# Patient Record
Sex: Female | Born: 1980 | Race: White | Hispanic: No | Smoking: Current some day smoker
Health system: Southern US, Community
[De-identification: ages and names within clinical notes are randomized; demographics above are authoritative.]

## PROBLEM LIST (undated history)

## (undated) DIAGNOSIS — J45909 Unspecified asthma, uncomplicated: Secondary | ICD-10-CM

## (undated) DIAGNOSIS — Z789 Other specified health status: Secondary | ICD-10-CM

## (undated) DIAGNOSIS — E876 Hypokalemia: Secondary | ICD-10-CM

## (undated) DIAGNOSIS — I10 Essential (primary) hypertension: Secondary | ICD-10-CM

## (undated) DIAGNOSIS — Z8619 Personal history of other infectious and parasitic diseases: Secondary | ICD-10-CM

## (undated) HISTORY — DX: Unspecified asthma, uncomplicated: J45.909

## (undated) HISTORY — DX: Essential (primary) hypertension: I10

## (undated) HISTORY — PX: WISDOM TOOTH EXTRACTION: SHX21

## (undated) HISTORY — DX: Personal history of other infectious and parasitic diseases: Z86.19

## (undated) HISTORY — PX: KNEE SURGERY: SHX244

## (undated) HISTORY — DX: Hypokalemia: E87.6

---

## 2002-05-09 ENCOUNTER — Encounter: Admission: RE | Admit: 2002-05-09 | Discharge: 2002-06-19 | Payer: Self-pay

## 2009-05-10 ENCOUNTER — Emergency Department (HOSPITAL_BASED_OUTPATIENT_CLINIC_OR_DEPARTMENT_OTHER): Admission: EM | Admit: 2009-05-10 | Discharge: 2009-05-10 | Payer: Self-pay | Admitting: Emergency Medicine

## 2009-05-10 ENCOUNTER — Ambulatory Visit: Payer: Self-pay | Admitting: Diagnostic Radiology

## 2015-02-18 LAB — OB RESULTS CONSOLE GC/CHLAMYDIA
CHLAMYDIA, DNA PROBE: NEGATIVE
GC PROBE AMP, GENITAL: NEGATIVE

## 2015-02-21 LAB — OB RESULTS CONSOLE ABO/RH: RH TYPE: POSITIVE

## 2015-02-21 LAB — OB RESULTS CONSOLE ANTIBODY SCREEN: Antibody Screen: NEGATIVE

## 2015-02-21 LAB — OB RESULTS CONSOLE HIV ANTIBODY (ROUTINE TESTING): HIV: NONREACTIVE

## 2015-02-21 LAB — OB RESULTS CONSOLE RPR: RPR: NONREACTIVE

## 2015-02-21 LAB — OB RESULTS CONSOLE HEPATITIS B SURFACE ANTIGEN: Hepatitis B Surface Ag: NEGATIVE

## 2015-02-26 LAB — OB RESULTS CONSOLE RUBELLA ANTIBODY, IGM: RUBELLA: IMMUNE

## 2015-07-08 ENCOUNTER — Encounter (HOSPITAL_COMMUNITY): Payer: Self-pay | Admitting: *Deleted

## 2015-07-08 ENCOUNTER — Inpatient Hospital Stay (HOSPITAL_COMMUNITY)
Admission: AD | Admit: 2015-07-08 | Discharge: 2015-07-11 | DRG: 781 | Disposition: A | Discharge status: Home / Self Care | Payer: BC Managed Care – PPO | Source: Ambulatory Visit | Attending: Obstetrics and Gynecology | Admitting: Obstetrics and Gynecology

## 2015-07-08 DIAGNOSIS — R9431 Abnormal electrocardiogram [ECG] [EKG]: Secondary | ICD-10-CM

## 2015-07-08 DIAGNOSIS — O26893 Other specified pregnancy related conditions, third trimester: Secondary | ICD-10-CM

## 2015-07-08 DIAGNOSIS — O99283 Endocrine, nutritional and metabolic diseases complicating pregnancy, third trimester: Principal | ICD-10-CM | POA: Diagnosis present

## 2015-07-08 DIAGNOSIS — R519 Headache, unspecified: Secondary | ICD-10-CM

## 2015-07-08 DIAGNOSIS — Z3A30 30 weeks gestation of pregnancy: Secondary | ICD-10-CM | POA: Diagnosis not present

## 2015-07-08 DIAGNOSIS — O09513 Supervision of elderly primigravida, third trimester: Secondary | ICD-10-CM

## 2015-07-08 DIAGNOSIS — R51 Headache: Secondary | ICD-10-CM

## 2015-07-08 DIAGNOSIS — O133 Gestational [pregnancy-induced] hypertension without significant proteinuria, third trimester: Secondary | ICD-10-CM | POA: Diagnosis present

## 2015-07-08 DIAGNOSIS — E876 Hypokalemia: Secondary | ICD-10-CM | POA: Diagnosis present

## 2015-07-08 DIAGNOSIS — O9989 Other specified diseases and conditions complicating pregnancy, childbirth and the puerperium: Secondary | ICD-10-CM | POA: Diagnosis not present

## 2015-07-08 HISTORY — DX: Other specified health status: Z78.9

## 2015-07-08 LAB — URINALYSIS, ROUTINE W REFLEX MICROSCOPIC
Bilirubin Urine: NEGATIVE
Glucose, UA: NEGATIVE mg/dL
HGB URINE DIPSTICK: NEGATIVE
Ketones, ur: NEGATIVE mg/dL
Leukocytes, UA: NEGATIVE
NITRITE: NEGATIVE
PROTEIN: NEGATIVE mg/dL
SPECIFIC GRAVITY, URINE: 1.01 (ref 1.005–1.030)
pH: 7.5 (ref 5.0–8.0)

## 2015-07-08 LAB — CBC
HEMATOCRIT: 31.2 % — AB (ref 36.0–46.0)
Hemoglobin: 11 g/dL — ABNORMAL LOW (ref 12.0–15.0)
MCH: 30.9 pg (ref 26.0–34.0)
MCHC: 35.3 g/dL (ref 30.0–36.0)
MCV: 87.6 fL (ref 78.0–100.0)
PLATELETS: 247 10*3/uL (ref 150–400)
RBC: 3.56 MIL/uL — ABNORMAL LOW (ref 3.87–5.11)
RDW: 12.8 % (ref 11.5–15.5)
WBC: 11.7 10*3/uL — AB (ref 4.0–10.5)

## 2015-07-08 LAB — COMPREHENSIVE METABOLIC PANEL
ALBUMIN: 3 g/dL — AB (ref 3.5–5.0)
ALT: 21 U/L (ref 14–54)
ANION GAP: 10 (ref 5–15)
AST: 21 U/L (ref 15–41)
Alkaline Phosphatase: 69 U/L (ref 38–126)
BUN: 6 mg/dL (ref 6–20)
CHLORIDE: 104 mmol/L (ref 101–111)
CO2: 24 mmol/L (ref 22–32)
Calcium: 9 mg/dL (ref 8.9–10.3)
Creatinine, Ser: 0.54 mg/dL (ref 0.44–1.00)
GFR calc Af Amer: >60 mL/min (ref >60)
GFR calc non Af Amer: >60 mL/min (ref >60)
GLUCOSE: 75 mg/dL (ref 65–99)
POTASSIUM: 2.4 mmol/L — AB (ref 3.5–5.1)
SODIUM: 138 mmol/L (ref 135–145)
Total Bilirubin: 0.6 mg/dL (ref 0.3–1.2)
Total Protein: 6.4 g/dL — ABNORMAL LOW (ref 6.5–8.1)

## 2015-07-08 LAB — PROTEIN / CREATININE RATIO, URINE
CREATININE, URINE: 45 mg/dL
PROTEIN CREATININE RATIO: 0.13 mg/mg{creat} (ref 0.00–0.15)
TOTAL PROTEIN, URINE: 6 mg/dL

## 2015-07-08 LAB — URIC ACID: Uric Acid, Serum: 3.6 mg/dL (ref 2.3–6.6)

## 2015-07-08 LAB — LACTATE DEHYDROGENASE: LDH: 156 U/L (ref 98–192)

## 2015-07-08 LAB — TYPE AND SCREEN
ABO/RH(D): O POS
ANTIBODY SCREEN: NEGATIVE

## 2015-07-08 LAB — ABO/RH: ABO/RH(D): O POS

## 2015-07-08 LAB — MAGNESIUM: MAGNESIUM: 1.8 mg/dL (ref 1.7–2.4)

## 2015-07-08 MED ORDER — PRENATAL MULTIVITAMIN CH
1.0000 | ORAL_TABLET | Freq: Every day | ORAL | Status: DC
  Filled 2015-07-08 (×3): qty 1

## 2015-07-08 MED ORDER — ACETAMINOPHEN 500 MG PO TABS
1000.0000 mg | ORAL_TABLET | Freq: Once | ORAL | Status: AC
  Administered 2015-07-08: 1000 mg via ORAL
  Filled 2015-07-08: qty 2

## 2015-07-08 MED ORDER — ZOLPIDEM TARTRATE 5 MG PO TABS: 5.0000 mg | ORAL_TABLET | Freq: Every evening | ORAL | Status: DC | PRN

## 2015-07-08 MED ORDER — CALCIUM CARBONATE ANTACID 500 MG PO CHEW
2.0000 | CHEWABLE_TABLET | ORAL | Status: DC | PRN
  Filled 2015-07-08: qty 2

## 2015-07-08 MED ORDER — DOCUSATE SODIUM 100 MG PO CAPS
100.0000 mg | ORAL_CAPSULE | Freq: Every day | ORAL | Status: DC
  Filled 2015-07-08 (×3): qty 1

## 2015-07-08 MED ORDER — POTASSIUM CHLORIDE CRYS ER 20 MEQ PO TBCR
40.0000 meq | EXTENDED_RELEASE_TABLET | ORAL | Status: AC
  Administered 2015-07-08 (×2): 40 meq via ORAL
  Filled 2015-07-08 (×2): qty 2

## 2015-07-08 MED ORDER — ACETAMINOPHEN 325 MG PO TABS
650.0000 mg | ORAL_TABLET | ORAL | Status: DC | PRN
  Filled 2015-07-08: qty 2

## 2015-07-08 NOTE — H&P (Signed)
Pt is a 34 y/o white female G1P0 at 3330 5/7 weeks who call the office this am with a headache. [facility] had elevated B/Ps last week. Her B/P was ok however she had a K+ of 2.5 on her CMET. In light of this she had an EKG which was abnl. She has no hx to explain the hypokalemia. She was admitted to get oral K+ and have her EKG rechecked. She has had an uncomplicated pregnancy to date. She has AMA and had a nl NIPT.  Her PE is unremarkable IMP/ Hypokalemia with an abnl EKG Plan/ Will give Kdur 40mg  and repeat in 4 hours. Then repeat K+level. If K+ level is >3.5 will repeat the EKG. Plan d/w cardiology.

## 2015-07-08 NOTE — Progress Notes (Signed)
CRITICAL VALUE ALERT  Critical value received:  Potassium 2.4  Date of notification: 07/08/15   Time of notification:  1321  Critical value read back: yes  Nurse who received alert:  Ninfa MeekerJudy Senaya Dicenso RN  MD notified (1st page):  Blanche EastJ. Rasch NP  Time of first page:  Provider on unit  MD notified (2nd page):N/A  Time of second page:N/A  Responding MD:  N/.A  Time MD responded:  N/A

## 2015-07-08 NOTE — MAU Provider Note (Signed)
History     CSN: [id number]  Arrival date and time: 07/08/15 1106   None     Chief Complaint  Patient presents with  . Hypertension  . Headache   HPI   [name] is a 34 y.o. female G1P0 at 27w5dpresenting to MAU with headache. She started feeling "different" yesterday. Prior to HA she started experiencing nausea, it felt like she was going to throw up. She did not throw up, however continued to have nauea throughout the day, which worsened with eating. Yesterday evening she developed some pressure behind her head, denies pain, however feels pressure behind her eyes.  Resting, and sleeping helped some. Tylenol did not help.   Patient has a history of menstrual migraines. She never took anything for the migraine.   She had two pressures in the office last week that read: 140/89 140/86 and she was instructed to come in with any pre-eclampsia symptoms.   Currently her HA is a 4/10  OB History    Gravida Para Term Preterm AB TAB SAB Ectopic Multiple Living   1               Past Medical History  Diagnosis Date  . Medical history non-contributory     Past Surgical History  Procedure Laterality Date  . Knee surgery      approx 4 to 5 years ago    No family history on file.  Social History  Substance Use Topics  . Smoking status: Former SResearch scientist (life sciences) . Smokeless tobacco: None  . Alcohol Use: No    Allergies:  Allergies  Allergen Reactions  . Neosporin [Neomycin-Bacitracin Zn-Polymyx] Rash    Prescriptions prior to admission  Medication Sig Dispense Refill Last Dose  . acetaminophen (TYLENOL) 325 MG tablet Take 650 mg by mouth every 6 (six) hours as needed for moderate pain.   07/07/2015 at Unknown time  . calcium carbonate (TUMS - DOSED IN MG ELEMENTAL CALCIUM) 500 MG chewable tablet Chew 1 tablet by mouth as needed for indigestion or heartburn.   07/07/2015 at Unknown time  . Prenatal Vit-Fe Fumarate-FA (PRENATAL MULTIVITAMIN) TABS tablet Take 1 tablet by  mouth daily at 12 noon.   07/08/2015 at Unknown time   Results for orders placed or performed during the hospital encounter of 07/08/15 (from the past 48 hour(s))  Urinalysis, [facility] w reflex [facility]     Status: None   Collection Time: 07/08/15 11:40 AM  Result Value Ref Range   Color, Urine YELLOW YELLOW   APPearance CLEAR CLEAR   Specific Gravity, Urine 1.010 1.005 - 1.030   pH 7.5 5.0 - 8.0   Glucose, UA NEGATIVE NEGATIVE mg/dL   Hgb urine dipstick NEGATIVE NEGATIVE   Bilirubin Urine NEGATIVE NEGATIVE   Ketones, ur NEGATIVE NEGATIVE mg/dL   Protein, ur NEGATIVE NEGATIVE mg/dL   Nitrite NEGATIVE NEGATIVE   Leukocytes, UA NEGATIVE NEGATIVE    Comment: MICROSCOPIC NOT DONE ON URINES WITH NEGATIVE PROTEIN, BLOOD, LEUKOCYTES, NITRITE, OR GLUCOSE <1000 mg/dL.  Protein / creatinine ratio, urine     Status: None   Collection Time: 07/08/15 11:45 AM  Result Value Ref Range   Creatinine, Urine 45.00 mg/dL   Total Protein, Urine 6 mg/dL    Comment: NO NORMAL RANGE ESTABLISHED FOR THIS TEST   Protein Creatinine Ratio 0.13 0.00 - 0.15 mg/mg[Cre]  CBC     Status: Abnormal   Collection Time: 07/08/15 12:30 PM  Result Value Ref Range  WBC 11.7 (H) 4.0 - 10.5 K/uL   RBC 3.56 (L) 3.87 - 5.11 MIL/uL   Hemoglobin 11.0 (L) 12.0 - 15.0 g/dL   HCT 31.2 (L) 36.0 - 46.0 %   MCV 87.6 78.0 - 100.0 fL   MCH 30.9 26.0 - 34.0 pg   MCHC 35.3 30.0 - 36.0 g/dL   RDW 12.8 11.5 - 15.5 %   Platelets 247 150 - 400 K/uL  Comprehensive metabolic panel     Status: Abnormal   Collection Time: 07/08/15 12:30 PM  Result Value Ref Range   Sodium 138 135 - 145 mmol/L   Potassium 2.4 (LL) 3.5 - 5.1 mmol/L    Comment: CRITICAL RESULT CALLED TO, READ BACK BY AND VERIFIED WITH: JUDY LOWE 1320 07/08/15 BY A POTEAT    Chloride 104 101 - 111 mmol/L   CO2 24 22 - 32 mmol/L   Glucose, Bld 75 65 - 99 mg/dL   BUN 6 6 - 20 mg/dL   Creatinine, Ser 0.54 0.44 - 1.00 mg/dL   Calcium 9.0 8.9 - 10.3 mg/dL    Total Protein 6.4 (L) 6.5 - 8.1 g/dL   Albumin 3.0 (L) 3.5 - 5.0 g/dL   AST 21 15 - 41 U/L   ALT 21 14 - 54 U/L   Alkaline Phosphatase 69 38 - 126 U/L   Total Bilirubin 0.6 0.3 - 1.2 mg/dL   GFR calc non Af Amer >60 >60 mL/min   GFR calc Af Amer >60 >60 mL/min    Comment: (NOTE) The eGFR has been calculated using the CKD EPI equation. This calculation has not been validated in all clinical situations. eGFR's persistently <60 mL/min signify possible Chronic Kidney Disease.    Anion gap 10 5 - 15  Uric acid     Status: None   Collection Time: 07/08/15 12:30 PM  Result Value Ref Range   Uric Acid, Serum 3.6 2.3 - 6.6 mg/dL  Lactate dehydrogenase     Status: None   Collection Time: 07/08/15 12:30 PM  Result Value Ref Range   LDH 156 98 - 192 U/L  Magnesium     Status: None   Collection Time: 07/08/15 12:30 PM  Result Value Ref Range   Magnesium 1.8 1.7 - 2.4 mg/dL  Type and screen [facility]     Status: None   Collection Time: 07/08/15  3:00 PM  Result Value Ref Range   ABO/RH(D) O POS    Antibody Screen NEG    Sample Expiration 07/11/2015     Review of Systems  Constitutional: Negative for fever and chills.  Eyes: Negative for blurred vision.  Respiratory: Negative for shortness of breath.   Cardiovascular: Positive for leg swelling. Negative for chest pain, palpitations and orthopnea.  Gastrointestinal: Positive for nausea. Negative for vomiting and abdominal pain.  Genitourinary: Negative for dysuria.  Neurological: Positive for dizziness (When she is on her feet. ) and headaches.   Physical Exam   Blood pressure 134/80, pulse 77, temperature 98 F (36.7 C), temperature source Oral, resp. rate 20.   Patient Vitals for the past 24 hrs:  BP Temp Temp src Pulse Resp Height Weight  07/08/15 1721 - - - - 18 - -  07/08/15 1650 - - - - 18 - -  07/08/15 1550 - - - - 18 - -  07/08/15 1530 135/80 mmHg 98.4 F (36.9 C) Oral 79 - _0  (1.676 m) 194 lb  (87.998 kg)  07/08/15 1401 134/80 mmHg - -  77 - - -  07/08/15 1346 136/79 mmHg - - 70 - - -  07/08/15 1331 129/82 mmHg - - 73 - - -  07/08/15 1316 128/76 mmHg - - 77 - - -  07/08/15 1231 122/83 mmHg - - 74 - - -  07/08/15 1216 121/81 mmHg - - 77 - - -  07/08/15 1201 125/77 mmHg - - 78 - - -  07/08/15 1145 134/80 mmHg - - 70 - - -  07/08/15 1124 139/79 mmHg 98 F (36.7 C) Oral 66 20 - -    Physical Exam  Constitutional: She is oriented to person, place, and time. She appears well-developed and well-nourished. No distress.  HENT:  Head: Normocephalic.  Eyes: Pupils are equal, round, and reactive to light.  Cardiovascular: Normal rate.   Respiratory: Effort normal.  GI: Soft. She exhibits no distension. There is no tenderness.  Genitourinary:  Cervix: closed, thick, anterior.   Musculoskeletal: Normal range of motion.  Neurological: She is alert and oriented to person, place, and time.  Skin: Skin is warm. She is not diaphoretic.  Psychiatric: Her behavior is normal.   Fetal Tracing: Baseline: 135 bpm  Variability: Moderate  Accelerations: 15x15 Decelerations: none Toco: UI   MAU Course  Procedures  None  MDM  Potassium level 2.4 Spoke to Dr. Jeneen Rinks at [facility] ED due to critical K+ levels for further recommendation.  Magnesium lab check recommended, if less than 2.5 he recommends admission for IV magnesium.  EKG ordered stat.   1415: consulted with Dr. Radford Pax with Cardiology; patient has T-wave inversion on EKG; they recommend magnesium replacement and potassium replacement with an EKG in the AM.  1430: Dr. Ouida Sills notified of magnesium level, EKG results, cardiology recommendation and the recommendation to admit for electrolyte replacement. Dr. Ouida Sills to consult with [facility]/ medical management.  Admit to [facility]  Patient aware of plan of care.    Patient currently rates her pain 0/10.  Assessment and Plan   A:  1. Hypokalemia   2. Nonspecific abnormal  electrocardiogram (ECG) (EKG)   3. Headache in pregnancy, third trimester    P:  Admit to [facility] per Dr. Ouida Sills.    Lezlie Lye, NP 07/08/2015 5:22 PM

## 2015-07-08 NOTE — MAU Note (Signed)
Pt called MD office, was told to come to MAU, BP was 140/90, 140/80 last Wednesday.  Started having nausea over the weekend, HA that won't go away - describes as pressure, head feels hot.  Decreased FM since last night, also pelvic pressure.  Denies bleeding or LOF.

## 2015-07-09 LAB — BASIC METABOLIC PANEL
ANION GAP: 8 (ref 5–15)
BUN: 6 mg/dL (ref 6–20)
CO2: 22 mmol/L (ref 22–32)
Calcium: 8.6 mg/dL — ABNORMAL LOW (ref 8.9–10.3)
Chloride: 103 mmol/L (ref 101–111)
Creatinine, Ser: 0.57 mg/dL (ref 0.44–1.00)
GFR calc Af Amer: >60 mL/min (ref >60)
GLUCOSE: 114 mg/dL — AB (ref 65–99)
POTASSIUM: 2.6 mmol/L — AB (ref 3.5–5.1)
Sodium: 133 mmol/L — ABNORMAL LOW (ref 135–145)

## 2015-07-09 LAB — POTASSIUM: POTASSIUM: 3 mmol/L — AB (ref 3.5–5.1)

## 2015-07-09 MED ORDER — POTASSIUM CHLORIDE CRYS ER 20 MEQ PO TBCR
40.0000 meq | EXTENDED_RELEASE_TABLET | Freq: Four times a day (QID) | ORAL | Status: AC
  Administered 2015-07-09 – 2015-07-10 (×4): 40 meq via ORAL
  Filled 2015-07-09 (×4): qty 2

## 2015-07-09 MED ORDER — GUAIFENESIN ER 600 MG PO TB12
600.0000 mg | ORAL_TABLET | Freq: Two times a day (BID) | ORAL | Status: DC
Start: 2015-07-09 — End: 2015-07-12
  Administered 2015-07-09 – 2015-07-11 (×5): 600 mg via ORAL
  Filled 2015-07-09 (×7): qty 1

## 2015-07-09 MED ORDER — POTASSIUM CHLORIDE CRYS ER 20 MEQ PO TBCR
40.0000 meq | EXTENDED_RELEASE_TABLET | Freq: Once | ORAL | Status: AC
  Administered 2015-07-09: 40 meq via ORAL
  Filled 2015-07-09: qty 2

## 2015-07-09 MED ORDER — LORATADINE 10 MG PO TABS
10.0000 mg | ORAL_TABLET | Freq: Every day | ORAL | Status: DC
Start: 2015-07-09 — End: 2015-07-12
  Administered 2015-07-09 – 2015-07-11 (×3): 10 mg via ORAL
  Filled 2015-07-09 (×3): qty 1

## 2015-07-09 MED ORDER — LACTATED RINGERS IV SOLN
INTRAVENOUS | Status: DC | PRN
  Administered 2015-07-09: 18:00:00 via INTRAVENOUS

## 2015-07-09 MED ORDER — MAGNESIUM SULFATE 50 % IJ SOLN
2.0000 g | Freq: Once | INTRAVENOUS | Status: AC
  Administered 2015-07-09: 2 g via INTRAVENOUS
  Filled 2015-07-09: qty 4

## 2015-07-09 NOTE — Progress Notes (Signed)
CRITICAL VALUE ALERT  Critical value received:  potassium 2.6  Date of notification:  07/09/15  Time of notification:  1550  Critical value read back: yes  Nurse who received alert:  Jose Persiaarol mead rn   MD notified (1st page):  1553  Time of first page:  1553  MD notified (2nd page):  Time of second page:  Responding MD:  Dr. Henderson CloudHorvath  Time MD responded:  [phone]

## 2015-07-09 NOTE — Progress Notes (Signed)
34 y.o. G1P0 [redacted]w[redacted]d HD#1 admitted for 31WKS, HIGH BP but found to have Harris low K+ with abnormal EKG.  HA today feels more like sinus.  No other sx including no chest pain.  Pt currently stable with no c/o.  Good FM.  Filed Vitals:   07/08/15 1751 07/08/15 1857 07/08/15 2057 07/09/15 0651  BP:   146/73 131/84  Pulse:   76 75  Temp:   98.1 F (36.7 C)   TempSrc:      Resp: Height:      Weight:        Lungs CTA Cor RRR Abd  Soft, gravid, nontender Ex SCDs FHTs  120s, good short term variability, NST R Toco  occ  Results for orders placed or performed during the hospital encounter of 07/08/15 (from the past 24 hour(s))  Urinalysis, Routine w reflex microscopic (not at [facility])     Status: None   Collection Time: 07/08/15 11:40 AM  Result Value Ref Range   Color, Urine YELLOW YELLOW   APPearance CLEAR CLEAR   Specific Gravity, Urine 1.010 1.005 - 1.030   pH 7.5 5.0 - 8.0   Glucose, UA NEGATIVE NEGATIVE mg/dL   Hgb urine dipstick NEGATIVE NEGATIVE   Bilirubin Urine NEGATIVE NEGATIVE   Ketones, ur NEGATIVE NEGATIVE mg/dL   Protein, ur NEGATIVE NEGATIVE mg/dL   Nitrite NEGATIVE NEGATIVE   Leukocytes, UA NEGATIVE NEGATIVE  Protein / creatinine ratio, urine     Status: None   Collection Time: 07/08/15 11:45 AM  Result Value Ref Range   Creatinine, Urine 45.00 mg/dL   Total Protein, Urine 6 mg/dL   Protein Creatinine Ratio 0.13 0.00 - 0.15 mg/mg[Cre]  CBC     Status: Abnormal   Collection Time: 07/08/15 12:30 PM  Result Value Ref Range   WBC 11.7 (H) 4.0 - 10.5 K/uL   RBC 3.56 (L) 3.87 - 5.11 MIL/uL   Hemoglobin 11.0 (L) 12.0 - 15.0 g/dL   HCT 21.3 (L) 08.6 - 57.8 %   MCV 87.6 78.0 - 100.0 fL   MCH 30.9 26.0 - 34.0 pg   MCHC 35.3 30.0 - 36.0 g/dL   RDW 46.9 62.9 - 52.8 %   Platelets 247 150 - 400 K/uL  Comprehensive metabolic panel     Status: Abnormal   Collection Time: 07/08/15 12:30 PM  Result Value Ref Range   Sodium 138 135 - 145 mmol/L   Potassium 2.4 (LL)  3.5 - 5.1 mmol/L   Chloride 104 101 - 111 mmol/L   CO2 24 22 - 32 mmol/L   Glucose, Bld 75 65 - 99 mg/dL   BUN 6 6 - 20 mg/dL   Creatinine, Ser 4.13 0.44 - 1.00 mg/dL   Calcium 9.0 8.9 - 24.4 mg/dL   Total Protein 6.4 (L) 6.5 - 8.1 g/dL   Albumin 3.0 (L) 3.5 - 5.0 g/dL   AST 21 15 - 41 U/L   ALT 21 14 - 54 U/L   Alkaline Phosphatase 69 38 - 126 U/L   Total Bilirubin 0.6 0.3 - 1.2 mg/dL   GFR calc non Af Amer >60 >60 mL/min   GFR calc Af Amer >60 >60 mL/min   Anion gap 10 5 - 15  Uric acid     Status: None   Collection Time: 07/08/15 12:30 PM  Result Value Ref Range   Uric Acid, Serum 3.6 2.3 - 6.6 mg/dL  Lactate dehydrogenase     Status: None  Collection Time: 07/08/15 12:30 PM  Result Value Ref Range   LDH 156 98 - 192 U/L  Magnesium     Status: None   Collection Time: 07/08/15 12:30 PM  Result Value Ref Range   Magnesium 1.8 1.7 - 2.4 mg/dL  [facility]     Status: None   Collection Time: 07/08/15  3:00 PM  Result Value Ref Range   ABO/RH(D) O POS    Antibody Screen NEG    Sample Expiration 07/11/2015   ABO/Rh     Status: None   Collection Time: 07/08/15  3:00 PM  Result Value Ref Range   ABO/RH(D) O POS   Potassium     Status: Abnormal   Collection Time: 07/09/15  6:00 AM  Result Value Ref Range   Potassium 3.0 (L) 3.5 - 5.1 mmol/L    Harris:  HD#1  2336w6d with low K+ and high normal BPs.  P: K+ this am is 3.0-- will replete with another bag of K+ 40 meq and repeat K+ in 4hours after.  Repeat EKG after K+ value is above 3.5.  Pregnancy is stable.  Brittany Harris

## 2015-07-09 NOTE — Progress Notes (Signed)
Pt's repeat K+ was 2.6.  D/w hospitalist over phone- will increase K+ with 4 doses of 40 meq every six hours to push K+ back up.  Will check K+ in am- if still going in wrong direction, will need to consult nephrology to see if they can find out why she is potassium wasting.

## 2015-07-10 LAB — BASIC METABOLIC PANEL
Anion gap: 8 (ref 5–15)
BUN: 7 mg/dL (ref 6–20)
CHLORIDE: 106 mmol/L (ref 101–111)
CO2: 22 mmol/L (ref 22–32)
Calcium: 8.2 mg/dL — ABNORMAL LOW (ref 8.9–10.3)
Creatinine, Ser: 0.55 mg/dL (ref 0.44–1.00)
GFR calc non Af Amer: >60 mL/min (ref >60)
Glucose, Bld: 82 mg/dL (ref 65–99)
POTASSIUM: 3.1 mmol/L — AB (ref 3.5–5.1)
SODIUM: 136 mmol/L (ref 135–145)

## 2015-07-10 LAB — CREATININE, URINE, RANDOM: Creatinine, Urine: 55 mg/dL

## 2015-07-10 LAB — NA AND K (SODIUM & POTASSIUM), RAND UR
POTASSIUM UR: 36 mmol/L
SODIUM UR: 78 mmol/L

## 2015-07-10 LAB — CHLORIDE, URINE, RANDOM: CHLORIDE URINE: 79 mmol/L

## 2015-07-10 LAB — OSMOLALITY, URINE: OSMOLALITY UR: 329 mosm/kg (ref 300–900)

## 2015-07-10 MED ORDER — SODIUM CHLORIDE 0.9 % IJ SOLN
3.0000 mL | Freq: Two times a day (BID) | INTRAMUSCULAR | Status: DC
  Administered 2015-07-10 – 2015-07-11 (×2): 3 mL via INTRAVENOUS

## 2015-07-10 MED ORDER — POTASSIUM CHLORIDE CRYS ER 20 MEQ PO TBCR
40.0000 meq | EXTENDED_RELEASE_TABLET | Freq: Four times a day (QID) | ORAL | Status: AC
  Administered 2015-07-10 – 2015-07-11 (×4): 40 meq via ORAL
  Filled 2015-07-10 (×4): qty 2

## 2015-07-10 NOTE — Consult Note (Addendum)
Renal Service Consult Note [facility]  Brittany Harris 07/10/2015 Brittany Harris,Brittany Harris Requesting Physician:  Dr Dareen PianoAnderson  Reason for Consult:  Pregnant female w severe hypokalemia HPI: The patient is a 34 y.o. year-old with no sig PMH, no medications , admitted for labs showing low K levels in setting of recent headaches and borderline HTN (140/90).  Patient describes problems last week or so with HA's and dizziness. No hx HTN.  Went to see her doctor and BP was 140/90 or so, doesn't usually see doctors so doesn't know baseline.  She is 31 wks IUP.  K+ was 2.4 so pt admitted.  So far has received 280 mEq of K+ by mouth.  K bumped up to 3.0, then back down to 2.6 today.  Urine shows high pH , o/w negative.  UNa high and UNa high , Urine K / creat ratio is high at 65 mEq/gm (anything over 13 mEq/gm considered high) Patient denies any licorice use, hx tobacco, no diuretic or laxative use, no usual diets, no hx low K or HTN in the past.  Denies CP, sob, abd pain , no n/v/Harris, no abd pain, diarrhea or vomiting.   Past Medical History  Past Medical History  Diagnosis Date  . Medical history non-contributory    Past Surgical History  Past Surgical History  Procedure Laterality Date  . Knee surgery      approx 4 to 5 years ago  . Wisdom tooth extraction     Family History No family history on file. Social History  reports that she has quit smoking. She does not have any smokeless tobacco history on file. She reports that she does not drink alcohol or use illicit drugs. Allergies  Allergies  Allergen Reactions  . Neosporin [Neomycin-Bacitracin Zn-Polymyx] Rash   Home medications Prior to Admission medications   Medication Sig Start Date End Date Taking? Authorizing Provider  acetaminophen (TYLENOL) 325 MG tablet Take 650 mg by mouth every 6 (six) hours as needed for moderate pain.   Yes Historical Provider, MD  calcium carbonate (TUMS - DOSED IN MG ELEMENTAL CALCIUM) 500 MG  chewable tablet Chew 1 tablet by mouth as needed for indigestion or heartburn.   Yes Historical Provider, MD  Prenatal Vit-Fe Fumarate-FA (PRENATAL MULTIVITAMIN) TABS tablet Take 1 tablet by mouth daily at 12 noon.   Yes Historical Provider, MD   Liver Function Tests  Recent Labs Lab 07/08/15 1230  AST 21  ALT 21  ALKPHOS 69  BILITOT 0.6  PROT 6.4*  ALBUMIN 3.0*   No results for input(s): LIPASE, AMYLASE in the last 168 hours. CBC  Recent Labs Lab 07/08/15 1230  WBC 11.7*  HGB 11.0*  HCT 31.2*  MCV 87.6  PLT 247   Basic Metabolic Panel  Recent Labs Lab 07/08/15 1230 07/09/15 0600 07/09/15 1507 07/10/15 0645  NA 138  --  133* 136  K 2.4* 3.0* 2.6* 3.1*  CL 104  --  103 106  CO2 24  --  22 22  GLUCOSE 75  --  114* 82  BUN 6  --  6 7  CREATININE 0.54  --  0.57 0.55  CALCIUM 9.0  --  8.6* 8.2*    Filed Vitals:   07/10/15 0700 07/10/15 0830 07/10/15 1345 07/10/15 1515  BP: 135/74 139/83    Pulse: 75     Temp: 98.2 F (36.8 C) 98.2 F (36.8 C) 98 F (36.7 C)   TempSrc: Oral Axillary Oral   Resp: 18  18    Height:      Weight:    88.587 kg (195 lb 4.8 oz)   Exam Alert calm in no distress No rash, cyanosis or gangrene Sclera anicteric, throat clear No jvd or bruits Chest clear bilat RRR soft sem no RG Abd gravid, nontender, no HSM noted +bs GU deferred MS no joint effusion, deformity Ext trace ankle edema bilat Neuro alert no asterixis ox 3  UA pH 7.5, negative otherwise Urine K - 36 mMol/L Urine Na - 78 mMol/L Urine osm  329 mOsm/kg Urine creat  55 mg/dL Serum creat 1.61, serum CO2 = 22-24 Urine K/ Cr ratio = 65 mEq/gm (normal < 13 in setting of hypoK)   Assessment: 1 Hypokalemia - unclear cause, initial tests suggest renal K-wasting.  Urine pH high which could mean RTA , but serum CO2 only borderline low.  Will plan to get 24 hour urine for K and Ca, get plasma renin/ aldo levels and continue oral K supplementation.  Will follow.  She may have  hyperaldo or RTA or Gitelmans syndrome. Don't believe that she has surreptitious vomiting / laxative/ or diuretic use.   2 IUP 31 wks 3 Borderline HTN   Plan - as above  Vinson Moselle MD [facility] pager [phone]    cell [phone] 07/10/2015, 3:51 PM

## 2015-07-10 NOTE — Progress Notes (Signed)
Patient ID: Brittany Harris, female   DOB: [birth year], 34 y.o.   MRN: [id number]  HD# 3 34 yo G1P0 @ 31 weeks with hypokalemia  S: Feeling well today, active FM, no VB or LOF  O:  Filed Vitals:   07/09/15 1545 07/09/15 1947 07/10/15 0700 07/10/15 0830  BP: 137/76 134/88 135/74 139/83  Pulse: 82 75 75   Temp: 98.3 F (36.8 C) 98.4 F (36.9 C) 98.2 F (36.8 C) 98.2 F (36.8 C)  TempSrc: Oral Oral Oral Axillary  Resp: 18 18 18 18   Height:      Weight:       AOX3, NAD Abd Soft Cvx deferred  Results for orders placed or performed during the hospital encounter of 07/08/15 (from the past 24 hour(s))  Basic metabolic panel     Status: Abnormal   Collection Time: 07/09/15  3:07 PM  Result Value Ref Range   Sodium 133 (L) 135 - 145 mmol/L   Potassium 2.6 (LL) 3.5 - 5.1 mmol/L   Chloride 103 101 - 111 mmol/L   CO2 22 22 - 32 mmol/L   Glucose, Bld 114 (H) 65 - 99 mg/dL   BUN 6 6 - 20 mg/dL   Creatinine, Ser 5.640.57 0.44 - 1.00 mg/dL   Calcium 8.6 (L) 8.9 - 10.3 mg/dL   GFR calc non Af Amer >60 >60 mL/min   GFR calc Af Amer >60 >60 mL/min   Anion gap 8 5 - 15  Basic metabolic panel     Status: Abnormal   Collection Time: 07/10/15  6:45 AM  Result Value Ref Range   Sodium 136 135 - 145 mmol/L   Potassium 3.1 (L) 3.5 - 5.1 mmol/L   Chloride 106 101 - 111 mmol/L   CO2 22 22 - 32 mmol/L   Glucose, Bld 82 65 - 99 mg/dL   BUN 7 6 - 20 mg/dL   Creatinine, Ser 3.320.55 0.44 - 1.00 mg/dL   Calcium 8.2 (L) 8.9 - 10.3 mg/dL   GFR calc non Af Amer >60 >60 mL/min   GFR calc Af Amer >60 >60 mL/min   Anion gap 8 5 - 15   A/P 1) FWB normal 2) Will obtain nephrology consult for unexplained hypokalemia

## 2015-07-11 LAB — PROTEIN, URINE, 24 HOUR
Collection Interval-UPROT: 24 hours
URINE TOTAL VOLUME-UPROT: 4225 mL

## 2015-07-11 LAB — COMPREHENSIVE METABOLIC PANEL
ALK PHOS: 63 U/L (ref 38–126)
ALT: 17 U/L (ref 14–54)
AST: 18 U/L (ref 15–41)
Albumin: 2.5 g/dL — ABNORMAL LOW (ref 3.5–5.0)
Anion gap: 7 (ref 5–15)
BILIRUBIN TOTAL: 0.3 mg/dL (ref 0.3–1.2)
BUN: 7 mg/dL (ref 6–20)
CALCIUM: 8.2 mg/dL — AB (ref 8.9–10.3)
CO2: 22 mmol/L (ref 22–32)
CREATININE: 0.47 mg/dL (ref 0.44–1.00)
Chloride: 107 mmol/L (ref 101–111)
GFR calc Af Amer: >60 mL/min (ref >60)
Glucose, Bld: 82 mg/dL (ref 65–99)
POTASSIUM: 3.7 mmol/L (ref 3.5–5.1)
Sodium: 136 mmol/L (ref 135–145)
TOTAL PROTEIN: 5.5 g/dL — AB (ref 6.5–8.1)

## 2015-07-11 LAB — CREATININE, URINE, 24 HOUR
CREATININE 24H UR: 1502 mg/d (ref 600–1800)
CREATININE, URINE: 35.55 mg/dL
Collection Interval-UCRE24: 24 hours
URINE TOTAL VOLUME-UCRE24: 4225 mL

## 2015-07-11 MED ORDER — MAGNESIUM GLUCONATE 500 MG PO TABS
500.0000 mg | ORAL_TABLET | Freq: Every day | ORAL | Status: DC
  Filled 2015-07-11: qty 1

## 2015-07-11 MED ORDER — MAGNESIUM GLUCONATE 1000 (54 MG) MG/5ML PO LIQD
27.0000 mg | Freq: Once | ORAL | Status: DC
  Filled 2015-07-11: qty 2.5

## 2015-07-11 MED ORDER — MAGNESIUM OXIDE 400 (241.3 MG) MG PO TABS
400.0000 mg | ORAL_TABLET | Freq: Every day | ORAL | Status: DC
  Administered 2015-07-11: 400 mg via ORAL
  Filled 2015-07-11 (×2): qty 1

## 2015-07-11 MED ORDER — POTASSIUM CHLORIDE CRYS ER 20 MEQ PO TBCR
40.0000 meq | EXTENDED_RELEASE_TABLET | Freq: Every day | ORAL | Status: DC
  Administered 2015-07-11: 40 meq via ORAL
  Filled 2015-07-11 (×2): qty 2

## 2015-07-11 MED ORDER — MAGNESIUM OXIDE 400 (241.3 MG) MG PO TABS: 400.0000 mg | ORAL_TABLET | Freq: Every day | ORAL | Status: AC

## 2015-07-11 MED ORDER — POTASSIUM CHLORIDE CRYS ER 20 MEQ PO TBCR: 40.0000 meq | EXTENDED_RELEASE_TABLET | Freq: Every day | ORAL | Status: AC

## 2015-07-11 NOTE — Discharge Instructions (Signed)
Hypertension During Pregnancy °Hypertension, or high blood pressure, is when there is extra pressure inside your blood vessels that carry blood from the heart to the rest of your body (arteries). It can happen at any time in life, including pregnancy. Hypertension during pregnancy can cause problems for you and your baby. Your baby might not weigh as much as he or she should at birth or might be born early (premature). Very bad cases of hypertension during pregnancy can be life-threatening.  °Different types of hypertension can occur during pregnancy. These include: °· Chronic hypertension. This happens when a woman has hypertension before pregnancy and it continues during pregnancy. °· Gestational hypertension. This is when hypertension develops during pregnancy. °· Preeclampsia or toxemia of pregnancy. This is a very serious type of hypertension that develops only during pregnancy. It affects the whole body and can be very dangerous for both mother and baby.   °Gestational hypertension and preeclampsia usually go away after your baby is born. Your blood pressure will likely stabilize within 6 weeks. Women who have hypertension during pregnancy have a greater chance of developing hypertension later in life or with future pregnancies. °RISK FACTORS °There are certain factors that make it more likely for you to develop hypertension during pregnancy. These include: °· Having hypertension before pregnancy. °· Having hypertension during a previous pregnancy. °· Being overweight. °· Being older than 40 years. °· Being pregnant with more than one baby. °· Having diabetes or kidney problems. °SIGNS AND SYMPTOMS °Chronic and gestational hypertension rarely cause symptoms. Preeclampsia has symptoms, which may include: °· Increased protein in your urine. Your health care provider will check for this at every prenatal visit. °· Swelling of your hands and face. °· Rapid weight gain. °· Headaches. °· Visual changes. °· Being  bothered by light. °· Abdominal pain, especially in the upper right area. °· Chest pain. °· Shortness of breath. °· Increased reflexes. °· Seizures. These occur with a more severe form of preeclampsia, called eclampsia. °DIAGNOSIS  °You may be diagnosed with hypertension during a regular prenatal exam. At each prenatal visit, you may have: °· Your blood pressure checked. °· A urine test to check for protein in your urine. °The type of hypertension you are diagnosed with depends on when you developed it. It also depends on your specific blood pressure reading. °· Developing hypertension before 20 weeks of pregnancy is consistent with chronic hypertension. °· Developing hypertension after 20 weeks of pregnancy is consistent with gestational hypertension. °· Hypertension with increased urinary protein is diagnosed as preeclampsia. °· Blood pressure measurements that stay above 160 systolic or 110 diastolic are a sign of severe preeclampsia. °TREATMENT °Treatment for hypertension during pregnancy varies. Treatment depends on the type of hypertension and how serious it is. °· If you take medicine for chronic hypertension, you may need to switch medicines. °¨ Medicines called ACE inhibitors should not be taken during pregnancy. °¨ Low-dose aspirin may be suggested for women who have risk factors for preeclampsia. °· If you have gestational hypertension, you may need to take a blood pressure medicine that is safe during pregnancy. Your health care provider will recommend the correct medicine. °· If you have severe preeclampsia, you may need to be in the hospital. Health care providers will watch you and your baby very closely. You also may need to take medicine called magnesium sulfate to prevent seizures and lower blood pressure. °· Sometimes, an early delivery is needed. This may be the case if the condition worsens. It would be   done to protect you and your baby. The only cure for preeclampsia is delivery.  Your health  care provider may recommend that you take one low-dose aspirin (81 mg) each day to help prevent high blood pressure during your pregnancy if you are at risk for preeclampsia. You may be at risk for preeclampsia if:  You had preeclampsia or eclampsia during a previous pregnancy.  Your baby did not grow as expected during a previous pregnancy.  You experienced preterm birth with a previous pregnancy.  You experienced a separation of the placenta from the uterus (placental abruption) during a previous pregnancy.  You experienced the loss of your baby during a previous pregnancy.  You are pregnant with more than one baby.  You have other medical conditions, such as diabetes or an autoimmune disease. HOME CARE INSTRUCTIONS  Schedule and keep all of your regular prenatal care appointments. This is important.  Take medicines only as directed by your health care provider. Tell your health care provider about all medicines you take.  Eat as little salt as possible.  Get regular exercise.  Do not drink alcohol.  Do not use tobacco products.  Do not drink products with caffeine.  Lie on your left side when resting. SEEK IMMEDIATE MEDICAL CARE IF:  You have severe abdominal pain.  You have sudden swelling in your hands, ankles, or face.  You gain 4 pounds (1.8 kg) or more in 1 week.  You vomit repeatedly.  You have vaginal bleeding.  You do not feel your baby moving as much.  You have a headache.  You have blurred or double vision.  You have muscle twitching or spasms.  You have shortness of breath.  You have blue fingernails or lips.  You have blood in your urine. MAKE SURE YOU:  Understand these instructions.  Will watch your condition.  Will get help right away if you are not doing well or get worse.   This information is not intended to replace advice given to you by your health care provider. Make sure you discuss any questions you have with your health care  provider.   Document Released: 04/14/2011 Document Revised: 08/17/2014 Document Reviewed: 02/23/2013 Elsevier Interactive Patient Education 2016 [employer] Preterm Labor Information Preterm labor is when labor starts at less than 37 weeks of pregnancy. The normal length of a pregnancy is 39 to 41 weeks. CAUSES Often, there is no identifiable underlying cause as to why a woman goes into preterm labor. One of the most common known causes of preterm labor is infection. Infections of the uterus, cervix, vagina, amniotic sac, bladder, kidney, or even the lungs (pneumonia) can cause labor to start. Other suspected causes of preterm labor include:   Urogenital infections, such as yeast infections and bacterial vaginosis.   Uterine abnormalities (uterine shape, uterine septum, fibroids, or bleeding from the placenta).   A cervix that has been operated on (it may fail to stay closed).   Malformations in the fetus.   Multiple gestations (twins, triplets, and so on).   Breakage of the amniotic sac.  RISK FACTORS  Having a previous history of preterm labor.   Having premature rupture of membranes (PROM).   Having a placenta that covers the opening of the cervix (placenta previa).   Having a placenta that separates from the uterus (placental abruption).   Having a cervix that is too weak to hold the fetus in the uterus (incompetent cervix).   Having too much fluid in the amniotic sac (  polyhydramnios).   Taking illegal drugs or smoking while pregnant.   Not gaining enough weight while pregnant.   Being younger than 6 and older than 35 years old.   Having a low socioeconomic status.   Being African American. SYMPTOMS Signs and symptoms of preterm labor include:   Menstrual-like cramps, abdominal pain, or back pain.  Uterine contractions that are regular, as frequent as six in an hour, regardless of their intensity (may be mild or painful).  Contractions that  start on the top of the uterus and spread down to the lower abdomen and back.   A sense of increased pelvic pressure.   A watery or bloody mucus discharge that comes from the vagina.  TREATMENT Depending on the length of the pregnancy and other circumstances, your health care provider may suggest bed rest. If necessary, there are medicines that can be given to stop contractions and to mature the fetal lungs. If labor happens before 34 weeks of pregnancy, a prolonged hospital stay may be recommended. Treatment depends on the condition of both you and the fetus.  WHAT SHOULD YOU DO IF YOU THINK YOU ARE IN PRETERM LABOR? Call your health care provider right away. You will need to go to the hospital to get checked immediately. HOW CAN YOU PREVENT PRETERM LABOR IN FUTURE PREGNANCIES? You should:   Stop smoking if you smoke.  Maintain healthy weight gain and avoid chemicals and drugs that are not necessary.  Be watchful for any type of infection.  Inform your health care provider if you have a known history of preterm labor.   This information is not intended to replace advice given to you by your health care provider. Make sure you discuss any questions you have with your health care provider.   Document Released: 10/17/2003 Document Revised: 03/29/2013 Document Reviewed: 08/29/2012 Elsevier Interactive Patient Education 2016 Elsevier Inc. Fetal Movement Counts Patient Name: __________________________________________________ Patient Due Date: ____________________ Performing a fetal movement count is highly recommended in high-risk pregnancies, but it is good for every pregnant woman to do. Your health care provider may ask you to start counting fetal movements at 28 weeks of the pregnancy. Fetal movements often increase:  After eating a full meal.  After physical activity.  After eating or drinking something sweet or cold.  At rest. Pay attention to when you feel the baby is most  active. This will help you notice a pattern of your baby's sleep and wake cycles and what factors contribute to an increase in fetal movement. It is important to perform a fetal movement count at the same time each day when your baby is normally most active.  HOW TO COUNT FETAL MOVEMENTS  Find a quiet and comfortable area to sit or lie down on your left side. Lying on your left side provides the best blood and oxygen circulation to your baby.  Write down the day and time on a sheet of paper or in a journal.  Start counting kicks, flutters, swishes, rolls, or jabs in a 2-hour period. You should feel at least 10 movements within 2 hours.  If you do not feel 10 movements in 2 hours, wait 2-3 hours and count again. Look for a change in the pattern or not enough counts in 2 hours. SEEK MEDICAL CARE IF:  You feel less than 10 counts in 2 hours, tried twice.  There is no movement in over an hour.  The pattern is changing or taking longer each day to reach 10  counts in 2 hours.  You feel the baby is not moving as he or she usually does. Date: ____________ Movements: ____________ Start time: ____________ Doreatha Martin time: ____________  Date: ____________ Movements: ____________ Start time: ____________ Doreatha Martin time: ____________ Date: ____________ Movements: ____________ Start time: ____________ Doreatha Martin time: ____________ Date: ____________ Movements: ____________ Start time: ____________ Doreatha Martin time: ____________ Date: ____________ Movements: ____________ Start time: ____________ Doreatha Martin time: ____________ Date: ____________ Movements: ____________ Start time: ____________ Doreatha Martin time: ____________ Date: ____________ Movements: ____________ Start time: ____________ Doreatha Martin time: ____________ Date: ____________ Movements: ____________ Start time: ____________ Doreatha Martin time: ____________  Date: ____________ Movements: ____________ Start time: ____________ Doreatha Martin time: ____________ Date: ____________  Movements: ____________ Start time: ____________ Doreatha Martin time: ____________ Date: ____________ Movements: ____________ Start time: ____________ Doreatha Martin time: ____________ Date: ____________ Movements: ____________ Start time: ____________ Doreatha Martin time: ____________ Date: ____________ Movements: ____________ Start time: ____________ Doreatha Martin time: ____________ Date: ____________ Movements: ____________ Start time: ____________ Doreatha Martin time: ____________ Date: ____________ Movements: ____________ Start time: ____________ Doreatha Martin time: ____________  Date: ____________ Movements: ____________ Start time: ____________ Doreatha Martin time: ____________ Date: ____________ Movements: ____________ Start time: ____________ Doreatha Martin time: ____________ Date: ____________ Movements: ____________ Start time: ____________ Doreatha Martin time: ____________ Date: ____________ Movements: ____________ Start time: ____________ Doreatha Martin time: ____________ Date: ____________ Movements: ____________ Start time: ____________ Doreatha Martin time: ____________ Date: ____________ Movements: ____________ Start time: ____________ Doreatha Martin time: ____________ Date: ____________ Movements: ____________ Start time: ____________ Doreatha Martin time: ____________  Date: ____________ Movements: ____________ Start time: ____________ Doreatha Martin time: ____________ Date: ____________ Movements: ____________ Start time: ____________ Doreatha Martin time: ____________ Date: ____________ Movements: ____________ Start time: ____________ Doreatha Martin time: ____________ Date: ____________ Movements: ____________ Start time: ____________ Doreatha Martin time: ____________ Date: ____________ Movements: ____________ Start time: ____________ Doreatha Martin time: ____________ Date: ____________ Movements: ____________ Start time: ____________ Doreatha Martin time: ____________ Date: ____________ Movements: ____________ Start time: ____________ Doreatha Martin time: ____________  Date: ____________ Movements: ____________ Start time:  ____________ Doreatha Martin time: ____________ Date: ____________ Movements: ____________ Start time: ____________ Doreatha Martin time: ____________ Date: ____________ Movements: ____________ Start time: ____________ Doreatha Martin time: ____________ Date: ____________ Movements: ____________ Start time: ____________ Doreatha Martin time: ____________ Date: ____________ Movements: ____________ Start time: ____________ Doreatha Martin time: ____________ Date: ____________ Movements: ____________ Start time: ____________ Doreatha Martin time: ____________ Date: ____________ Movements: ____________ Start time: ____________ Doreatha Martin time: ____________  Date: ____________ Movements: ____________ Start time: ____________ Doreatha Martin time: ____________ Date: ____________ Movements: ____________ Start time: ____________ Doreatha Martin time: ____________ Date: ____________ Movements: ____________ Start time: ____________ Doreatha Martin time: ____________ Date: ____________ Movements: ____________ Start time: ____________ Doreatha Martin time: ____________ Date: ____________ Movements: ____________ Start time: ____________ Doreatha Martin time: ____________ Date: ____________ Movements: ____________ Start time: ____________ Doreatha Martin time: ____________ Date: ____________ Movements: ____________ Start time: ____________ Doreatha Martin time: ____________  Date: ____________ Movements: ____________ Start time: ____________ Doreatha Martin time: ____________ Date: ____________ Movements: ____________ Start time: ____________ Doreatha Martin time: ____________ Date: ____________ Movements: ____________ Start time: ____________ Doreatha Martin time: ____________ Date: ____________ Movements: ____________ Start time: ____________ Doreatha Martin time: ____________ Date: ____________ Movements: ____________ Start time: ____________ Doreatha Martin time: ____________ Date: ____________ Movements: ____________ Start time: ____________ Doreatha Martin time: ____________ Date: ____________ Movements: ____________ Start time: ____________ Doreatha Martin time: ____________  Date:  ____________ Movements: ____________ Start time: ____________ Doreatha Martin time: ____________ Date: ____________ Movements: ____________ Start time: ____________ Doreatha Martin time: ____________ Date: ____________ Movements: ____________ Start time: ____________ Doreatha Martin time: ____________ Date: ____________ Movements: ____________ Start time: ____________ Doreatha Martin time: ____________ Date: ____________ Movements: ____________ Start time: ____________ Doreatha Martin time: ____________ Date: ____________ Movements: ____________ Start time: ____________ Doreatha Martin time: ____________   This information is not intended to replace advice given  to you by your health care provider. Make sure you discuss any questions you have with your health care provider.   Document Released: 08/26/2006 Document Revised: 08/17/2014 Document Reviewed: 05/23/2012 Elsevier Interactive Patient Education [employer] Elsevier Inc.

## 2015-07-11 NOTE — Progress Notes (Signed)
Dr. Hermelinda MedicusSchwartz,  Notified that patient was ready for discharge by Nephrology.  Will notify notify Dr. Chestine Sporelark.  Joesph Julyoletta Brenda Samano, RN

## 2015-07-11 NOTE — Progress Notes (Signed)
Patient seen by nephrology this afternoon.  Potassium has stabilized.  Plan is to do formal work up after delivery.  Will continue KCl 40mEq daily with goal K >3.8, 400mg  mag oxide daily.  OK for discharge today  Patient continues to feel well.  No s/sx preeclampsia.   EKG repeat day after admission was reviewed by cardiology and read as normal.   BPs since admission have been mainly 130-140/70-90s, with isolated 154/94.  Will plan d/c to home today Elevated BP c/w GHTN vs preeclampsia without severe features--24 hr urine protein collected and pending.  Discussed likely delivery at 37 wks should BPs remain moderate range F/u up early next week for repeat preE labs, including K/Mg w NST All questions answered.

## 2015-07-11 NOTE — Progress Notes (Signed)
Patient ID: Brittany Harris, female   DOB: 05-19-1981, [age] y.o.   MRN: [id number]  HD# 3 34 yo G1P0 @ 31 weeks with hypokalemia  S: Feeling well today, active FM, no VB or LOF , No HA/BV/RUQ pain  O:  Filed Vitals:   07/10/15 1936 07/11/15 0008 07/11/15 0800 07/11/15 0808  BP: 138/87 146/68 152/94   Pulse: 88 80 76   Temp: 98.3 F (36.8 C) 98 F (36.7 C) 97.6 F (36.4 C)   TempSrc: Oral Oral Oral   Resp: 20 18    Height:      Weight:    88.179 kg (194 lb 6.4 oz)   AOX3, NAD Abd Soft Cvx deferred Ext: trace edema bilaterally  Results for orders placed or performed during the hospital encounter of 07/08/15 (from the past 24 hour(s))  Na and K (sodium & potassium), rand urine     Status: None   Collection Time: 07/10/15 10:25 AM  Result Value Ref Range   Sodium, Ur 78 mmol/L   Potassium Urine Timed 36 mmol/L  Creatinine, urine, random     Status: None   Collection Time: 07/10/15 10:25 AM  Result Value Ref Range   Creatinine, Urine 55.00 mg/dL  Osmolality, urine     Status: None   Collection Time: 07/10/15 10:25 AM  Result Value Ref Range   Osmolality, Ur 329 300 - 900 mOsm/kg  Chloride, urine, random     Status: None   Collection Time: 07/10/15 10:25 AM  Result Value Ref Range   Chloride Urine 79 mmol/L  Comprehensive metabolic panel     Status: Abnormal   Collection Time: 07/11/15  5:43 AM  Result Value Ref Range   Sodium 136 135 - 145 mmol/L   Potassium 3.7 3.5 - 5.1 mmol/L   Chloride 107 101 - 111 mmol/L   CO2 22 22 - 32 mmol/L   Glucose, Bld 82 65 - 99 mg/dL   BUN 7 6 - 20 mg/dL   Creatinine, Ser 6.960.47 0.44 - 1.00 mg/dL   Calcium 8.2 (L) 8.9 - 10.3 mg/dL   Total Protein 5.5 (L) 6.5 - 8.1 g/dL   Albumin 2.5 (L) 3.5 - 5.0 g/dL   AST 18 15 - 41 U/L   ALT 17 14 - 54 U/L   Alkaline Phosphatase 63 38 - 126 U/L   Total Bilirubin 0.3 0.3 - 1.2 mg/dL   GFR calc non Af Amer >60 >60 mL/min   GFR calc Af Amer >60 >60 mL/min   Anion gap 7 5 - 15   A/P G1 @ 3370w1d w  hypokalemia 1. Hypokalemia: Nephrology consulted yesterday, 24 hr urine collection for K and Ca in progress.  Potassium wnl this AM at 3.7.  2. Elevated BPs: asymptomatic.  Urine p/c ratio 0.13 on admission, will add urine protein to 24hr urine collection in progress, but low suspicion for preeclampsia at this time.  Will monitor closely, initiate therapy for BP persistently >160/110.  3. AM NST pending, reactive yesterday

## 2015-07-11 NOTE — Discharge Summary (Signed)
Obstetric Discharge Summary Reason for Admission: hypokalemia--K 2.4 on admission Prenatal Procedures: NST  Patient was admitted with severe hypokalemia that was recalcitrant to normal potassium repletion.  Nephrology was consulted.  DDx includes hyperaldosteronism,  RTA or Gitelmans syndrome.  On day of discharge, K was normalized at 3.7 and Mg wnl.  Renal recommended discharge to home with f/u after delivery for further evaluation.  They recommend continued supplementation w K and Mg during pregnancy, w goal K>3.8  BPs during admission also mildly elevated, mainly 130-140/70-90s.  PreE labs wnl.  24 urine collected and urine protein undetectable-  HEMOGLOBIN  Date Value Ref Range Status  07/08/2015 11.0* 12.0 - 15.0 g/dL Final   HCT  Date Value Ref Range Status  07/08/2015 31.2* 36.0 - 46.0 % Final      Discharge Diagnoses: hypokalemia and likely GHTN  Discharge Information: Date: 07/11/2015 Activity: unrestricted Diet: routine Medications: PNV, KCl, Mg oxide Condition: stable Instructions: refer to practice specific booklet Discharge to: home Follow-up Information    [facility] Steadman, MD On 07/15/2015.   Specialty:  Obstetrics   Why:  Monday or Tues for NST, OB visit, repeat labs   Contact information:   [address] [place] [address] [phone]        Brittany Harris 07/11/2015, 5:36 PM

## 2015-07-11 NOTE — Progress Notes (Signed)
  [facility] Progress Note   Subjective: no complaints  Filed Vitals:   07/11/15 0008 07/11/15 0800 07/11/15 0808 07/11/15 1158  BP: 146/68 152/94  133/70  Pulse: 80 76  76  Temp: 98 F (36.7 C) 97.6 F (36.4 C)  98.2 F (36.8 C)  TempSrc: Oral Oral  Oral  Resp: 18   18  Height:      Weight:   88.179 kg (194 lb 6.4 oz)     Inpatient medications: . docusate sodium  100 mg Oral Daily  . guaiFENesin  600 mg Oral BID  . loratadine  10 mg Oral Daily  . magnesium gluconate  500 mg Oral Daily  . potassium chloride  40 mEq Oral Daily  . prenatal multivitamin  1 tablet Oral Q1200  . sodium chloride  3 mL Intravenous Q12H     acetaminophen, calcium carbonate, lactated ringers, zolpidem  Exam: Alert calm in no distress No rash, cyanosis or gangrene Sclera anicteric, throat clear No jvd or bruits Chest clear bilat RRR soft sem no RG Abd gravid, nontender, no HSM noted +bs GU deferred MS no joint effusion, deformity Ext trace ankle edema bilat Neuro alert no asterixis ox 3  UA pH 7.5, neg otherwise   Assessment: 1 Hypokalemia - hyperaldo, RAS, vs other.  Have d/w colleagues, plan is to maintain Mg/ K levels during pregnancy and do work-up after delivery (2-3 months). Recommend KCl 40/ d with adjustments as needed to keep K > 3.8, also po Mg ordered as Mg level was borderline. Our office will contact patient at home to arrange office f/u evaluation for this condition,  ~ 3 months post delivery.  Will sign off.  2 IUP 31 wks 3 Borderline HTN   Plan - as above   Brittany Moselleob [name] MD [facility] pager [phone]    cell [phone] 07/11/2015, 3:49 PM    Recent Labs Lab 07/09/15 1507 07/10/15 0645 07/11/15 0543  NA 133* 136 136  K 2.6* 3.1* 3.7  CL 103 106 107  CO2 22 22 22   GLUCOSE 114* 82 82  BUN 6 7 7   CREATININE 0.57 0.55 0.47  CALCIUM 8.6* 8.2* 8.2*    Recent Labs Lab 07/08/15 1230 07/11/15 0543  AST 21 18  ALT 21 17   ALKPHOS 69 63  BILITOT 0.6 0.3  PROT 6.4* 5.5*  ALBUMIN 3.0* 2.5*    Recent Labs Lab 07/08/15 1230  WBC 11.7*  HGB 11.0*  HCT 31.2*  MCV 87.6  PLT 247

## 2015-07-13 LAB — ALDOSTERONE + RENIN ACTIVITY W/ RATIO
ALDO / PRA Ratio: 1.3 (ref 0.0–30.0)
Aldosterone: 1.1 ng/dL (ref 0.0–30.0)
PRA LC/MS/MS: 0.88 ng/mL/h

## 2015-08-09 LAB — OB RESULTS CONSOLE GBS: GBS: NEGATIVE

## 2015-08-11 NOTE — L&D Delivery Note (Signed)
Delivery Note Patient pushed for 3 hours.  At 6:34 PM a viable female was delivered via Vaginal, Spontaneous Delivery (Presentation: Right Occiput Anterior).  APGAR: 9, 9; weight 7 lb 13.8 oz (3565 g).   Placenta status: Intact, Spontaneous.  Cord: 3 vessels with the following complications: None.  Cord pH: n/a  Significant vulvar edema was noted after delivery.  Due to concern for high risk of urinary retention, a foley catheter was inserted after delivery.    Anesthesia: Local, Epidural  Episiotomy:  none Lacerations:  2nd degree perineal Suture Repair: 2.0 vicryl rapide Est. Blood Loss (mL):  500 mL  Mom to postpartum.  Baby to Couplet care / Skin to Skin.  [facility] GEFFEL Brittany Harris 09/12/2015, 8:35 PM

## 2015-09-05 ENCOUNTER — Other Ambulatory Visit: Payer: Self-pay | Admitting: Obstetrics and Gynecology

## 2015-09-06 ENCOUNTER — Encounter (HOSPITAL_COMMUNITY): Payer: Self-pay | Admitting: *Deleted

## 2015-09-06 ENCOUNTER — Telehealth (HOSPITAL_COMMUNITY): Payer: Self-pay | Admitting: *Deleted

## 2015-09-06 NOTE — Telephone Encounter (Signed)
Preadmission screen  

## 2015-09-09 ENCOUNTER — Telehealth (HOSPITAL_COMMUNITY): Payer: Self-pay | Admitting: *Deleted

## 2015-09-09 ENCOUNTER — Encounter (HOSPITAL_COMMUNITY): Payer: Self-pay | Admitting: *Deleted

## 2015-09-09 NOTE — Telephone Encounter (Signed)
Preadmission screen  

## 2015-09-11 ENCOUNTER — Encounter (HOSPITAL_COMMUNITY): Payer: Self-pay

## 2015-09-11 ENCOUNTER — Inpatient Hospital Stay (HOSPITAL_COMMUNITY)
Admission: RE | Admit: 2015-09-11 | Discharge: 2015-09-14 | DRG: 775 | Disposition: A | Discharge status: Home / Self Care | Payer: BC Managed Care – PPO | Source: Ambulatory Visit | Attending: Obstetrics and Gynecology | Admitting: Obstetrics and Gynecology

## 2015-09-11 DIAGNOSIS — Z87891 Personal history of nicotine dependence: Secondary | ICD-10-CM | POA: Diagnosis not present

## 2015-09-11 DIAGNOSIS — Z809 Family history of malignant neoplasm, unspecified: Secondary | ICD-10-CM

## 2015-09-11 DIAGNOSIS — Z3A40 40 weeks gestation of pregnancy: Secondary | ICD-10-CM | POA: Diagnosis not present

## 2015-09-11 DIAGNOSIS — O480 Post-term pregnancy: Secondary | ICD-10-CM | POA: Diagnosis present

## 2015-09-11 DIAGNOSIS — O409XX0 Polyhydramnios, unspecified trimester, not applicable or unspecified: Principal | ICD-10-CM | POA: Diagnosis present

## 2015-09-11 DIAGNOSIS — Z3490 Encounter for supervision of normal pregnancy, unspecified, unspecified trimester: Secondary | ICD-10-CM

## 2015-09-11 LAB — CBC
HEMATOCRIT: 33.1 % — AB (ref 36.0–46.0)
Hemoglobin: 11.5 g/dL — ABNORMAL LOW (ref 12.0–15.0)
MCH: 30.5 pg (ref 26.0–34.0)
MCHC: 34.7 g/dL (ref 30.0–36.0)
MCV: 87.8 fL (ref 78.0–100.0)
PLATELETS: 260 10*3/uL (ref 150–400)
RBC: 3.77 MIL/uL — ABNORMAL LOW (ref 3.87–5.11)
RDW: 14.1 % (ref 11.5–15.5)
WBC: 12.4 10*3/uL — AB (ref 4.0–10.5)

## 2015-09-11 LAB — TYPE AND SCREEN
ABO/RH(D): O POS
Antibody Screen: NEGATIVE

## 2015-09-11 LAB — RPR: RPR Ser Ql: NONREACTIVE

## 2015-09-11 MED ORDER — TERBUTALINE SULFATE 1 MG/ML IJ SOLN
0.2500 mg | Freq: Once | INTRAMUSCULAR | Status: DC | PRN
  Filled 2015-09-11: qty 1

## 2015-09-11 MED ORDER — CITRIC ACID-SODIUM CITRATE 334-500 MG/5ML PO SOLN: 30.0000 mL | ORAL | Status: DC | PRN

## 2015-09-11 MED ORDER — LIDOCAINE HCL (PF) 1 % IJ SOLN
30.0000 mL | INTRAMUSCULAR | Status: DC | PRN
  Administered 2015-09-12: 30 mL via SUBCUTANEOUS
  Filled 2015-09-11: qty 30

## 2015-09-11 MED ORDER — EPHEDRINE 5 MG/ML INJ
10.0000 mg | INTRAVENOUS | Status: DC | PRN
  Filled 2015-09-11: qty 2

## 2015-09-11 MED ORDER — OXYCODONE-ACETAMINOPHEN 5-325 MG PO TABS: 1.0000 | ORAL_TABLET | ORAL | Status: DC | PRN

## 2015-09-11 MED ORDER — FLEET ENEMA 7-19 GM/118ML RE ENEM: 1.0000 | ENEMA | RECTAL | Status: DC | PRN

## 2015-09-11 MED ORDER — LACTATED RINGERS IV SOLN
500.0000 mL | INTRAVENOUS | Status: DC | PRN
  Administered 2015-09-12: 500 mL via INTRAVENOUS

## 2015-09-11 MED ORDER — OXYCODONE-ACETAMINOPHEN 5-325 MG PO TABS: 2.0000 | ORAL_TABLET | ORAL | Status: DC | PRN

## 2015-09-11 MED ORDER — BUTORPHANOL TARTRATE 1 MG/ML IJ SOLN
1.0000 mg | INTRAMUSCULAR | Status: DC | PRN
  Administered 2015-09-11 (×2): 1 mg via INTRAVENOUS
  Filled 2015-09-11: qty 1
  Filled 2015-09-11: qty 2

## 2015-09-11 MED ORDER — ACETAMINOPHEN 325 MG PO TABS: 650.0000 mg | ORAL_TABLET | ORAL | Status: DC | PRN

## 2015-09-11 MED ORDER — MISOPROSTOL 25 MCG QUARTER TABLET
25.0000 ug | ORAL_TABLET | ORAL | Status: DC | PRN
  Administered 2015-09-11 (×2): 25 ug via VAGINAL
  Filled 2015-09-11: qty 0.25
  Filled 2015-09-11: qty 1
  Filled 2015-09-11 (×2): qty 0.25

## 2015-09-11 MED ORDER — OXYTOCIN 10 UNIT/ML IJ SOLN
1.0000 m[IU]/min | INTRAVENOUS | Status: DC
  Administered 2015-09-11: 1 m[IU]/min via INTRAVENOUS
  Filled 2015-09-11: qty 4

## 2015-09-11 MED ORDER — OXYTOCIN BOLUS FROM INFUSION
500.0000 mL | INTRAVENOUS | Status: DC
  Administered 2015-09-12: 500 mL via INTRAVENOUS

## 2015-09-11 MED ORDER — FENTANYL 2.5 MCG/ML BUPIVACAINE 1/10 % EPIDURAL INFUSION (WH - ANES)
14.0000 mL/h | INTRAMUSCULAR | Status: DC | PRN
  Administered 2015-09-12 (×4): 14 mL/h via EPIDURAL
  Filled 2015-09-11 (×3): qty 125

## 2015-09-11 MED ORDER — OXYTOCIN 10 UNIT/ML IJ SOLN
2.5000 [IU]/h | INTRAVENOUS | Status: DC
  Filled 2015-09-11: qty 4

## 2015-09-11 MED ORDER — PHENYLEPHRINE 40 MCG/ML (10ML) SYRINGE FOR IV PUSH (FOR BLOOD PRESSURE SUPPORT)
80.0000 ug | PREFILLED_SYRINGE | INTRAVENOUS | Status: DC | PRN
  Filled 2015-09-11: qty 20
  Filled 2015-09-11: qty 2

## 2015-09-11 MED ORDER — DIPHENHYDRAMINE HCL 50 MG/ML IJ SOLN: 12.5000 mg | INTRAMUSCULAR | Status: DC | PRN

## 2015-09-11 MED ORDER — LACTATED RINGERS IV SOLN
INTRAVENOUS | Status: DC
  Administered 2015-09-11: 01:00:00 via INTRAVENOUS
  Administered 2015-09-11: 125 mL/h via INTRAVENOUS
  Administered 2015-09-12 (×2): via INTRAVENOUS

## 2015-09-11 MED ORDER — ONDANSETRON HCL 4 MG/2ML IJ SOLN: 4.0000 mg | Freq: Four times a day (QID) | INTRAMUSCULAR | Status: DC | PRN

## 2015-09-11 NOTE — H&P (Signed)
Brittany Harris is a 35 y.o. female presenting for IOL for polyhydramnios  35 yo G1P0 At 40+0 presents for IOL for polyhydramnios. Polyhydramnios was noted incidentally on a follow-up US for growth. Polyhydramnios has persisted. Additionally, She was incidentally found to have low potassium. She was seen by nephrology and placed on K+ & Magnesium.  History OB History    Gravida Para Term Preterm AB TAB SAB Ectopic Multiple Living   1         0     Past Medical History  Diagnosis Date  . Medical history non-contributory   . Hypokalemia   . Hx of varicella   . Asthma     exercise induced  . Hypertension    Past Surgical History  Procedure Laterality Date  . Knee surgery      approx 4 to 5 years ago  . Wisdom tooth extraction     Family History: family history includes Cancer in her paternal aunt. Social History:  reports that she quit smoking about 8 months ago. She has never used smokeless tobacco. She reports that she does not drink alcohol or use illicit drugs.   Prenatal Transfer Tool  Maternal Diabetes: No Genetic Screening: Normal Maternal Ultrasounds/Referrals: Abnormal:  Findings:   Other:polyhydramnios Fetal Ultrasounds or other Referrals:  None Maternal Substance Abuse:  No Significant Maternal Medications:  None Significant Maternal Lab Results:  None Other Comments:  None  ROS  Dilation: 1 Effacement (%): 40 Station: Ballotable Exam by:: M.Merrill, RN Blood pressure 119/76, pulse 88, temperature 98.6 F (37 C), temperature source Axillary, resp. rate 16, height  (1.676 m), weight 88.905 kg (196 lb). Exam Physical Exam  Prenatal labs: ABO, Rh: --/--/O POS (02/01 0100) Antibody: NEG (02/01 0100) Rubella: Immune (07/19 0000) RPR: Nonreactive (07/14 0000)  HBsAg: Negative (07/14 0000)  HIV: Non-reactive (07/14 0000)  GBS: Negative (12/30 0000)   Assessment/Plan: 1) Admit 2) Cyctotec 3) Epidural on request   Suki Crockett H. 09/11/2015, 8:54  AM

## 2015-09-11 NOTE — Progress Notes (Signed)
Patient ID: Brittany Harris, female   DOB: 1981-05-10, 35 y.o.   MRN: [id number]   S: Comfortable but feeling contractions O: Filed Vitals:   09/11/15 0407 09/11/15 0522 09/11/15 0629 09/11/15 0631  BP: 125/80 124/80 119/76 119/76  Pulse: 79 76 88 88  Temp:    98.6 F (37 C)  TempSrc:    Axillary  Resp: Height:      Weight:       AOx3 FHR 140 reactive, category 1 tracing Cvx 1/60/-2 toco Q2  Foley bulb placed without difficulty  A/P  1) Continue induction 2) Fetal well being reassuring

## 2015-09-12 ENCOUNTER — Encounter (HOSPITAL_COMMUNITY): Payer: Self-pay

## 2015-09-12 ENCOUNTER — Inpatient Hospital Stay (HOSPITAL_COMMUNITY): Payer: BC Managed Care – PPO | Admitting: Anesthesiology

## 2015-09-12 MED ORDER — DIPHENHYDRAMINE HCL 25 MG PO CAPS: 25.0000 mg | ORAL_CAPSULE | Freq: Four times a day (QID) | ORAL | Status: DC | PRN

## 2015-09-12 MED ORDER — OXYTOCIN 10 UNIT/ML IJ SOLN: 1.0000 m[IU]/min | INTRAMUSCULAR | Status: DC

## 2015-09-12 MED ORDER — ONDANSETRON HCL 4 MG PO TABS: 4.0000 mg | ORAL_TABLET | ORAL | Status: DC | PRN

## 2015-09-12 MED ORDER — ACETAMINOPHEN 325 MG PO TABS: 650.0000 mg | ORAL_TABLET | ORAL | Status: DC | PRN

## 2015-09-12 MED ORDER — MAGNESIUM OXIDE 400 (241.3 MG) MG PO TABS
400.0000 mg | ORAL_TABLET | Freq: Every day | ORAL | Status: DC
  Administered 2015-09-12 – 2015-09-14 (×2): 400 mg via ORAL
  Filled 2015-09-12 (×3): qty 1

## 2015-09-12 MED ORDER — BENZOCAINE-MENTHOL 20-0.5 % EX AERO
1.0000 "application " | INHALATION_SPRAY | CUTANEOUS | Status: DC | PRN
  Administered 2015-09-13 – 2015-09-14 (×2): 1 via TOPICAL
  Filled 2015-09-12 (×2): qty 56

## 2015-09-12 MED ORDER — PRENATAL MULTIVITAMIN CH
1.0000 | ORAL_TABLET | Freq: Every day | ORAL | Status: DC
  Administered 2015-09-14: 1 via ORAL
  Filled 2015-09-12 (×2): qty 1

## 2015-09-12 MED ORDER — LANOLIN HYDROUS EX OINT: TOPICAL_OINTMENT | CUTANEOUS | Status: DC | PRN

## 2015-09-12 MED ORDER — DIBUCAINE 1 % RE OINT
1.0000 "application " | TOPICAL_OINTMENT | RECTAL | Status: DC | PRN
  Administered 2015-09-14: 1 via RECTAL
  Filled 2015-09-12: qty 28

## 2015-09-12 MED ORDER — ONDANSETRON HCL 4 MG/2ML IJ SOLN: 4.0000 mg | INTRAMUSCULAR | Status: DC | PRN

## 2015-09-12 MED ORDER — ACETAMINOPHEN 500 MG PO TABS
1000.0000 mg | ORAL_TABLET | Freq: Once | ORAL | Status: AC
  Administered 2015-09-12: 1000 mg via ORAL
  Filled 2015-09-12: qty 2

## 2015-09-12 MED ORDER — ALBUTEROL SULFATE (2.5 MG/3ML) 0.083% IN NEBU
3.0000 mL | INHALATION_SOLUTION | Freq: Four times a day (QID) | RESPIRATORY_TRACT | Status: DC | PRN
Start: 2015-09-12 — End: 2015-09-14

## 2015-09-12 MED ORDER — POTASSIUM CHLORIDE CRYS ER 20 MEQ PO TBCR
40.0000 meq | EXTENDED_RELEASE_TABLET | Freq: Every day | ORAL | Status: DC
  Administered 2015-09-12 – 2015-09-14 (×2): 40 meq via ORAL
  Filled 2015-09-12 (×3): qty 2

## 2015-09-12 MED ORDER — LIDOCAINE HCL (PF) 1 % IJ SOLN
INTRAMUSCULAR | Status: DC | PRN
  Administered 2015-09-12: 6 mL via EPIDURAL
  Administered 2015-09-12: 8 mL via EPIDURAL

## 2015-09-12 MED ORDER — TETANUS-DIPHTH-ACELL PERTUSSIS 5-2.5-18.5 LF-MCG/0.5 IM SUSP: 0.5000 mL | Freq: Once | INTRAMUSCULAR | Status: DC

## 2015-09-12 MED ORDER — IBUPROFEN 600 MG PO TABS
600.0000 mg | ORAL_TABLET | Freq: Four times a day (QID) | ORAL | Status: DC
  Administered 2015-09-13 – 2015-09-14 (×7): 600 mg via ORAL
  Filled 2015-09-12 (×7): qty 1

## 2015-09-12 MED ORDER — OXYCODONE-ACETAMINOPHEN 5-325 MG PO TABS: 1.0000 | ORAL_TABLET | ORAL | Status: DC | PRN

## 2015-09-12 MED ORDER — SIMETHICONE 80 MG PO CHEW: 80.0000 mg | CHEWABLE_TABLET | ORAL | Status: DC | PRN

## 2015-09-12 MED ORDER — OXYCODONE-ACETAMINOPHEN 5-325 MG PO TABS
2.0000 | ORAL_TABLET | ORAL | Status: DC | PRN
  Administered 2015-09-12 – 2015-09-14 (×7): 2 via ORAL
  Filled 2015-09-12 (×7): qty 2

## 2015-09-12 MED ORDER — SENNOSIDES-DOCUSATE SODIUM 8.6-50 MG PO TABS
2.0000 | ORAL_TABLET | ORAL | Status: DC
  Administered 2015-09-13 (×2): 2 via ORAL
  Filled 2015-09-12 (×2): qty 2

## 2015-09-12 MED ORDER — WITCH HAZEL-GLYCERIN EX PADS
1.0000 "application " | MEDICATED_PAD | CUTANEOUS | Status: DC | PRN
  Administered 2015-09-14: 1 via TOPICAL

## 2015-09-12 NOTE — Lactation Note (Signed)
This note was copied from the chart of Brittany Jimmye Wisnieski. Lactation Consultation Note  Patient Name: Brittany Harris [id number] Date: 09/12/2015 Reason for consult: Initial assessment Baby at 4 hr of life and mom is worried about latch. The L nipple is longer than the R and baby goes to the L more easily. She has not had success getting a good latch ion the R. The nipple is sore and red. Demonstrated manual expression, colostrum noted bilaterally. Given a spoon. Discussed baby behavior, pumping, feeding frequency, baby belly size, voids, wt loss, breast changes, and nipple care. Given lactation handouts. Aware of OP services and support group. Encouraged mom to call for help at the next feeding to get baby latched to the R.       Maternal Data Has patient been taught Hand Expression?: Yes Does the patient have breastfeeding experience prior to this delivery?: No  Feeding Feeding Type: Breast Fed Length of feed:  (no latch )  LATCH Score/Interventions Latch: Too sleepy or reluctant, no latch achieved, no sucking elicited. Intervention(s): Skin to skin  Audible Swallowing: None Intervention(s): Skin to skin  Type of Nipple: Flat Intervention(s): Reverse pressure  Comfort (Breast/Nipple): Soft / non-tender     Hold (Positioning): Assistance needed to correctly position infant at breast and maintain latch.  LATCH Score: 4  Lactation Tools Discussed/Used WIC Program: No   Consult Status Consult Status: Follow-up Date: 09/13/15 Follow-up type: In-patient    Rulon Eisenmenger 09/12/2015, 10:44 PM

## 2015-09-12 NOTE — Anesthesia Preprocedure Evaluation (Signed)
Anesthesia Evaluation  Patient identified by MRN, date of birth, ID band Patient awake    Reviewed: Allergy & Precautions, H&P , NPO status , Patient's Chart, lab work & pertinent test results  Airway Mallampati: I  TM Distance: >3 FB Neck ROM: full    Dental no notable dental hx.    Pulmonary former smoker,    Pulmonary exam normal        Cardiovascular hypertension, negative cardio ROS Normal cardiovascular exam     Neuro/Psych negative neurological ROS  negative psych ROS   GI/Hepatic negative GI ROS, Neg liver ROS,   Endo/Other  negative endocrine ROS  Renal/GU negative Renal ROS     Musculoskeletal   Abdominal (+) + obese,   Peds  Hematology negative hematology ROS (+)   Anesthesia Other Findings   Reproductive/Obstetrics (+) Pregnancy                             Anesthesia Physical Anesthesia Plan  ASA: II  Anesthesia Plan: Epidural   Post-op Pain Management:    Induction:   Airway Management Planned:   Additional Equipment:   Intra-op Plan:   Post-operative Plan:   Informed Consent: I have reviewed the patients History and Physical, chart, labs and discussed the procedure including the risks, benefits and alternatives for the proposed anesthesia with the patient or authorized representative who has indicated his/her understanding and acceptance.     Plan Discussed with:   Anesthesia Plan Comments:         Anesthesia Quick Evaluation

## 2015-09-12 NOTE — Anesthesia Procedure Notes (Addendum)
Epidural Patient location during procedure: OB Start time: 09/12/2015 12:56 AM End time: 09/12/2015 1:00 AM  Staffing Anesthesiologist: Leilani Able Performed by: anesthesiologist   Preanesthetic Checklist Completed: patient identified, surgical consent, pre-op evaluation, timeout performed, IV checked, risks and benefits discussed and monitors and equipment checked  Epidural Patient position: sitting Prep: site prepped and draped and DuraPrep Patient monitoring: continuous pulse ox and blood pressure Approach: midline Location: L3-L4 Injection technique: LOR air  Needle:  Needle type: Tuohy  Needle gauge: 17 G Needle length: 9 cm and 9 Needle insertion depth: 6 cm Catheter type: closed end flexible Catheter size: 19 Gauge Catheter at skin depth: 11 cm Test dose: negative and Other  Assessment Sensory level: T9 Events: blood not aspirated, injection not painful, no injection resistance, negative IV test and no paresthesia  Additional Notes Pain goal is 5.Reason for block:procedure for pain

## 2015-09-12 NOTE — Progress Notes (Signed)
Spoke with Dr Dareen Piano. Turn off Pitocin for 30 minutes then restart at half (38milliunits/min). Patient informed. Will continue to monitor.

## 2015-09-12 NOTE — Progress Notes (Signed)
Patient pushing with excellent effort.  Fetal tachycardia noted in the 170s but moderate variability between contractions w + scalp stim w exam.  Tc 100.46F.  No maternal tachycardia.  As temp < 100.72F, does not meet definition of chorioamnionitis at this time.  Tylenol 1 gm now.   Will monitor closely

## 2015-09-12 NOTE — Progress Notes (Signed)
Pt had Foley yesterday. Now 50/4/-2. AROM clear. IUPC placed. NST reactive

## 2015-09-13 LAB — CBC
HCT: 25.9 % — ABNORMAL LOW (ref 36.0–46.0)
HEMOGLOBIN: 8.8 g/dL — AB (ref 12.0–15.0)
MCH: 29.9 pg (ref 26.0–34.0)
MCHC: 34 g/dL (ref 30.0–36.0)
MCV: 88.1 fL (ref 78.0–100.0)
Platelets: 208 10*3/uL (ref 150–400)
RBC: 2.94 MIL/uL — AB (ref 3.87–5.11)
RDW: 14.5 % (ref 11.5–15.5)
WBC: 17.7 10*3/uL — AB (ref 4.0–10.5)

## 2015-09-13 NOTE — Progress Notes (Signed)
Patient is doing well.  She is ambulating, voiding, tolerating PO.  Pain control is good.  Lochia is appropriate  Filed Vitals:   09/12/15 2037 09/12/15 2147 09/13/15 0139 09/13/15 0645  BP: 119/65 128/75 110/60 132/60  Pulse:  72 85 79  Temp: 98.5 F (36.9 C) 97.7 F (36.5 C) 98.2 F (36.8 C) 98 F (36.7 C)  TempSrc: Oral Oral Oral   Resp: Height:      Weight:      SpO2:        NAD Fundus firm Ext: 2+ edema b/l  Lab Results  Component Value Date   WBC 17.7* 09/13/2015   HGB 8.8* 09/13/2015   HCT 25.9* 09/13/2015   MCV 88.1 09/13/2015   PLT 208 09/13/2015    --/--/O POS (02/01 0100)/RImmune  A/P 34 y.o. G1P1001 PPD#1 s/p TSVD. Routine care.   Foley in place d/t significant vulvar edema and concern for potential urinary retention.  Will remove early this afternoon Expect d/c tomorrow.   Desires circ--baby w latching difficulties--will circ later today or tomorrow  [facility] GEFFEL [place]

## 2015-09-13 NOTE — Anesthesia Postprocedure Evaluation (Signed)
Anesthesia Post Note  Patient: Brittany Harris  Procedure(s) Performed: * No procedures listed *  Patient location during evaluation: Mother Baby Anesthesia Type: Epidural Level of consciousness: awake and alert Pain management: pain level controlled Vital Signs Assessment: post-procedure vital signs reviewed and stable Respiratory status: spontaneous breathing Cardiovascular status: blood pressure returned to baseline and stable Postop Assessment: no headache, patient able to bend at knees, no signs of nausea or vomiting and adequate PO intake Anesthetic complications: no    Last Vitals:  Filed Vitals:   09/13/15 0139 09/13/15 0645  BP: 110/60 132/60  Pulse: 85 79  Temp: 36.8 C 36.7 C  Resp: 18 18    Last Pain:  Filed Vitals:   09/13/15 0718  PainSc: 2                  Annalina Needles

## 2015-09-13 NOTE — Lactation Note (Signed)
This note was copied from the chart of Brittany Mabry Santarelli. Lactation Consultation Note  Patient Name: Brittany Harris [id number] Date: 09/13/2015 Reason for consult: Follow-up assessment    With this mom and term baby, now 55 hours old. The baby has more that adequate wet and dirty diapers, which made mom feel less anxious. I assisted mom with hand expression, and she was able to collect about 2 ml's, which we spoon fed to the baby. We that applied 16 nipple shield, and mom learned how to porperly apply. The baby latched, few suckles, mom denied discomfort, but the baby fell asleep after a few suckles, and was left skin to skin. Mom advised to keep baby STS as much as possible, to increse baby's appetite, and stools. Mom reassured that she was doing well, had a good amount of colostrum, and that her baby was fine. Parents very receptive to teaching, and know to call for questions/concerns.   Maternal Data    Feeding Feeding Type: Breast Milk  LATCH Score/Interventions Latch: Repeated attempts needed to sustain latch, nipple held in mouth throughout feeding, stimulation needed to elicit sucking reflex. (baby would not latch without nipple shield, so 16 applied with instruction to mom on how to ) Intervention(s): Skin to skin;Teach feeding cues;Waking techniques Intervention(s): Adjust position;Assist with latch;Breast massage;Breast compression  Audible Swallowing: None Intervention(s): Skin to skin;Hand expression Intervention(s): Hand expression  Type of Nipple: Flat Intervention(s): Hand pump (16 nipple shield)  Comfort (Breast/Nipple): Filling, red/small blisters or bruises, mild/mod discomfort  Problem noted: Mild/Moderate discomfort (left red , tender nipple) Interventions  (Cracked/bleeding/bruising/blister): Expressed breast milk to nipple Interventions (Mild/moderate discomfort): Pre-pump if needed;Breast shields Interventions (Severe discomfort): Double electric pum  Hold  (Positioning): Assistance needed to correctly position infant at breast and maintain latch. Intervention(s): Breastfeeding basics reviewed;Support Pillows;Position options;Skin to skin  LATCH Score: 4  Lactation Tools Discussed/Used Nipple shield size: 16 Pump Review: Setup, frequency, and cleaning   Consult Status Consult Status: Follow-up Date: 09/13/15 Follow-up type: In-patient    Brittany Harris 09/13/2015, 2:06 PM

## 2015-09-13 NOTE — Lactation Note (Signed)
This note was copied from the chart of Brittany Harris. Lactation Consultation Note New mom frustrated d/t can't latch baby to Rt. Nipple. Lt. Nipple extremely sore and bruised d/t only BF on that side. Mom wearing shell on Rt. Nipple. Comfort gels given for painful red bruised nipple. Appears cracked in center, mom stated she always has a little crease but its much bigger now. Also appeared to have a bruised blister to top Rt. Of Lt. Nipple. Mom stated the nipple has always been raised that way, just not bruised.  Has generalized edema, especially in LE. Rt. Nipple w/short shaft on appearance, but when compressed, flattens. Hand expression reviewed, collected approx. 1/2 ml of colostrum in vial and inserted into NS. #20 NS fit well when latched baby in football position. Baby resisted several times screaming mad, calmed down, held in mouth, w/much stimulation suckled at breast. Taught positioning and body alignment. Baby would push back at times, had to keep reviewing things with mom. Put rolled up blanket under moms hand for support. Encouraged to use "C" hold to firm breast for latching. Mom in information over load as well as exhausted. When baby came off of NS noted nipple has shrunk in NS. Mom didn't c/o painful suckling. Nipple wet but no pooling of colostrum noted. Fitted mom w/#16 NS to Rt. Nipple and baby BF well, mom denied painful suckling. Had to keep reminding mom to keep baby cheek to breast, she would let him slip away from breast. Moms body very tense, encouraged to relax and take deep breaths to allow colostrum to flow for the baby. Encouraged at intervals to massage breast. Mom demonstrated NS application. Patient Name: Brittany Harris [id number] Date: 09/13/2015 Reason for consult: Follow-up assessment;Breast/nipple pain;Difficult latch   Maternal Data    Feeding Feeding Type: Breast Fed Length of feed: 40 min  LATCH Score/Interventions Latch: Repeated attempts needed to sustain  latch, nipple held in mouth throughout feeding, stimulation needed to elicit sucking reflex. Intervention(s): Skin to skin;Teach feeding cues;Waking techniques Intervention(s): Adjust position;Assist with latch;Breast massage;Breast compression  Audible Swallowing: A few with stimulation Intervention(s): Skin to skin;Hand expression Intervention(s): Alternate breast massage  Type of Nipple: Flat Intervention(s): Reverse pressure;Shells;Hand pump  Comfort (Breast/Nipple): Filling, red/small blisters or bruises, mild/mod discomfort  Problem noted: Severe discomfort;Cracked, bleeding, blisters, bruises Interventions  (Cracked/bleeding/bruising/blister): Expressed breast milk to nipple;Hand pump  Hold (Positioning): Full assist, staff holds infant at breast Intervention(s): Breastfeeding basics reviewed;Support Pillows;Position options;Skin to skin  LATCH Score: 4  Lactation Tools Discussed/Used Tools: Shells;Nipple Shields;Pump;Comfort gels Nipple shield size: 16;20 Shell Type: Inverted Breast pump type: Manual Date initiated:: 09/12/15   Consult Status Consult Status: Follow-up Date: 09/13/15 (in pm) Follow-up type: In-patient    Charyl Dancer 09/13/2015, 3:37 AM

## 2015-09-14 MED ORDER — FERROUS SULFATE 325 (65 FE) MG PO TABS: 325.0000 mg | ORAL_TABLET | Freq: Every day | ORAL | Status: AC

## 2015-09-14 MED ORDER — OXYCODONE-ACETAMINOPHEN 5-325 MG PO TABS: 1.0000 | ORAL_TABLET | Freq: Four times a day (QID) | ORAL | Status: AC | PRN

## 2015-09-14 MED ORDER — IBUPROFEN 600 MG PO TABS: 600.0000 mg | ORAL_TABLET | Freq: Four times a day (QID) | ORAL | Status: AC

## 2015-09-14 NOTE — Discharge Summary (Signed)
Obstetric Discharge Summary Reason for Admission: induction of labor Prenatal Procedures: none Intrapartum Procedures: spontaneous vaginal delivery Postpartum Procedures: none Complications-Operative and Postpartum: 2 degree perineal laceration HEMOGLOBIN  Date Value Ref Range Status  09/13/2015 8.8* 12.0 - 15.0 g/dL Final   HCT  Date Value Ref Range Status  09/13/2015 25.9* 36.0 - 46.0 % Final    Physical Exam:  General: alert, cooperative and appears stated age 35: appropriate Uterine Fundus: firm Incision: healing well DVT Evaluation: No evidence of DVT seen on physical exam.  Discharge Diagnoses: Term Pregnancy-delivered  Discharge Information: Date: 09/14/2015 Activity: pelvic rest Diet: routine Medications: PNV, Ibuprofen, Iron and Percocet Condition: stable Instructions: refer to practice specific booklet Discharge to: home Follow-up Information    Follow up with [facility] GEFFEL Chestine Spore, MD In 1 month.   Specialty:  Obstetrics   Why:  postpartum visit   Contact information:   [address] [place] [address] [phone]       Newborn Data: Live born female  Birth Weight: 7 lb 13.8 oz (3565 g) APGAR: 9, 9  Home with mother.  Easton Sivertson GEFFEL Ferman Basilio 09/14/2015, 9:27 AM

## 2015-09-14 NOTE — Lactation Note (Signed)
This note was copied from the chart of Brittany Tamerra Merkley. Lactation Consultation Note: Infant is 70 hours old. He is in nursery for circumcision. Mother states that infant is feeding well but she is still having some soreness. Observed nipple and no observed trauma . Advised mother in tips and techniques for better support and positioning to prevent painful latch. Mother request another pack of comfort gels. Mother is continuing to use a #16 mm nipple shield. She has a #20 if needed. I advised mother to page to check latch when infant is feeding. Mother states she is seeing very little colostrum in the shield. Discussed importance of post pumping when using a nipple shield. Advised mother to pump at least 3-4 times daily. Mother also advised to supplement infant with a curved tip or spoon to give additional colostrum after feeding. Parents very receptive to all teaching. Mother states she will phone [facility] dept when she plans to return for follow up visit.   Patient Name: Brittany Harris [id number] Date: 09/14/2015 Reason for consult: Follow-up assessment   Maternal Data    Feeding Feeding Type: Breast Fed Length of feed: 15 min  LATCH Score/Interventions Latch: Grasps breast easily, tongue down, lips flanged, rhythmical sucking. Intervention(s): Skin to skin  Audible Swallowing: A few with stimulation Intervention(s): Skin to skin  Type of Nipple: Flat (using a nipple shield, semi flat nipples) Intervention(s): Hand pump  Comfort (Breast/Nipple): Soft / non-tender  Problem noted: Mild/Moderate discomfort (has had some soreness, comfort gels at bedside ) Interventions (Mild/moderate discomfort): Comfort gels  Hold (Positioning): No assistance needed to correctly position infant at breast. Intervention(s): Breastfeeding basics reviewed;Skin to skin  LATCH Score: 8  Lactation Tools Discussed/Used Tools: Comfort gels Nipple shield size: 16   Consult Status Consult Status:  Complete    Michel Bickers 09/14/2015, 11:11 AM

## 2015-09-14 NOTE — Progress Notes (Signed)
Patient is doing well.  She is ambulating, voiding, tolerating PO.  Pain control is good.  Lochia is appropriate  Filed Vitals:   09/13/15 0645 09/13/15 0930 09/13/15 1806 09/14/15 0626  BP: 132/60 108/68 123/80 108/60  Pulse: 79 68 75 67  Temp: 98 F (36.7 C) 97.3 F (36.3 C) 98.1 F (36.7 C) 98.3 F (36.8 C)  TempSrc:  Oral Oral Oral  Resp: Height:      Weight:      SpO2:  100%  95%    NAD Fundus firm Ext: 2+ edema b/l  Lab Results  Component Value Date   WBC 17.7* 09/13/2015   HGB 8.8* 09/13/2015   HCT 25.9* 09/13/2015   MCV 88.1 09/13/2015   PLT 208 09/13/2015    --/--/O POS (02/01 0100)/RImmune  A/P 34 y.o. G1P1001 PPD#2 s/p TSVD. Routine care.   Voiding well after foley removal--vulvar edema resolving Desires circumcision. Discussed r/b/a of the procedure. Reviewed that circumcision is an elective surgical procedure and not considered medically necessary. Reviewed the risks of the procedure including the risk of infection, bleeding, damage to surrounding structures, including scrotum, shaft, urethra and head of penis, and an undesired cosmetic effect requiring additional procedures for revision. Consent signed.  Meeting all goals, d/c today   [facility] GEFFEL Chestine Spore

## 2016-01-14 DIAGNOSIS — J029 Acute pharyngitis, unspecified: Secondary | ICD-10-CM | POA: Diagnosis not present

## 2016-05-15 DIAGNOSIS — Z0000 Encounter for general adult medical examination without abnormal findings: Secondary | ICD-10-CM | POA: Diagnosis not present

## 2016-05-15 DIAGNOSIS — Z13220 Encounter for screening for lipoid disorders: Secondary | ICD-10-CM | POA: Diagnosis not present

## 2016-12-15 DIAGNOSIS — Z6827 Body mass index (BMI) 27.0-27.9, adult: Secondary | ICD-10-CM | POA: Diagnosis not present

## 2016-12-15 DIAGNOSIS — Z124 Encounter for screening for malignant neoplasm of cervix: Secondary | ICD-10-CM | POA: Diagnosis not present

## 2016-12-15 DIAGNOSIS — Z01419 Encounter for gynecological examination (general) (routine) without abnormal findings: Secondary | ICD-10-CM | POA: Diagnosis not present

## 2016-12-15 DIAGNOSIS — R5383 Other fatigue: Secondary | ICD-10-CM | POA: Diagnosis not present

## 2017-07-06 DIAGNOSIS — L659 Nonscarring hair loss, unspecified: Secondary | ICD-10-CM | POA: Diagnosis not present

## 2017-07-06 DIAGNOSIS — J4520 Mild intermittent asthma, uncomplicated: Secondary | ICD-10-CM | POA: Diagnosis not present

## 2017-07-06 DIAGNOSIS — Z13220 Encounter for screening for lipoid disorders: Secondary | ICD-10-CM | POA: Diagnosis not present

## 2017-07-06 DIAGNOSIS — B001 Herpesviral vesicular dermatitis: Secondary | ICD-10-CM | POA: Diagnosis not present

## 2017-07-06 DIAGNOSIS — Z0000 Encounter for general adult medical examination without abnormal findings: Secondary | ICD-10-CM | POA: Diagnosis not present

## 2018-01-21 DIAGNOSIS — Z124 Encounter for screening for malignant neoplasm of cervix: Secondary | ICD-10-CM | POA: Diagnosis not present

## 2018-01-21 DIAGNOSIS — Z01419 Encounter for gynecological examination (general) (routine) without abnormal findings: Secondary | ICD-10-CM | POA: Diagnosis not present

## 2018-01-21 DIAGNOSIS — Z6827 Body mass index (BMI) 27.0-27.9, adult: Secondary | ICD-10-CM | POA: Diagnosis not present

## 2018-01-24 DIAGNOSIS — Z803 Family history of malignant neoplasm of breast: Secondary | ICD-10-CM | POA: Diagnosis not present

## 2018-01-24 DIAGNOSIS — Z8042 Family history of malignant neoplasm of prostate: Secondary | ICD-10-CM | POA: Diagnosis not present

## 2018-03-09 DIAGNOSIS — N72 Inflammatory disease of cervix uteri: Secondary | ICD-10-CM | POA: Diagnosis not present

## 2018-03-09 DIAGNOSIS — R87619 Unspecified abnormal cytological findings in specimens from cervix uteri: Secondary | ICD-10-CM | POA: Diagnosis not present

## 2018-03-09 DIAGNOSIS — Z6827 Body mass index (BMI) 27.0-27.9, adult: Secondary | ICD-10-CM | POA: Diagnosis not present

## 2018-03-09 DIAGNOSIS — Z3202 Encounter for pregnancy test, result negative: Secondary | ICD-10-CM | POA: Diagnosis not present

## 2018-03-18 DIAGNOSIS — R109 Unspecified abdominal pain: Secondary | ICD-10-CM | POA: Diagnosis not present

## 2018-03-22 DIAGNOSIS — R102 Pelvic and perineal pain: Secondary | ICD-10-CM | POA: Diagnosis not present

## 2018-03-22 DIAGNOSIS — Z6827 Body mass index (BMI) 27.0-27.9, adult: Secondary | ICD-10-CM | POA: Diagnosis not present

## 2018-04-01 DIAGNOSIS — Z1231 Encounter for screening mammogram for malignant neoplasm of breast: Secondary | ICD-10-CM | POA: Diagnosis not present

## 2018-04-01 DIAGNOSIS — Z6827 Body mass index (BMI) 27.0-27.9, adult: Secondary | ICD-10-CM | POA: Diagnosis not present

## 2018-04-06 ENCOUNTER — Ambulatory Visit
Admission: RE | Admit: 2018-04-06 | Discharge: 2018-04-06 | Disposition: A | Discharge status: Home / Self Care | Payer: BLUE CROSS/BLUE SHIELD | Source: Ambulatory Visit | Attending: Physician Assistant | Admitting: Physician Assistant

## 2018-04-06 ENCOUNTER — Other Ambulatory Visit: Payer: Self-pay | Admitting: Physician Assistant

## 2018-04-06 DIAGNOSIS — R1011 Right upper quadrant pain: Secondary | ICD-10-CM

## 2018-04-06 DIAGNOSIS — K5900 Constipation, unspecified: Secondary | ICD-10-CM

## 2018-04-06 DIAGNOSIS — R1013 Epigastric pain: Secondary | ICD-10-CM

## 2018-04-06 DIAGNOSIS — R140 Abdominal distension (gaseous): Secondary | ICD-10-CM | POA: Diagnosis not present

## 2018-04-13 ENCOUNTER — Ambulatory Visit
Admission: RE | Admit: 2018-04-13 | Discharge: 2018-04-13 | Disposition: A | Discharge status: Home / Self Care | Payer: BLUE CROSS/BLUE SHIELD | Source: Ambulatory Visit | Attending: Physician Assistant | Admitting: Physician Assistant

## 2018-04-13 DIAGNOSIS — R1013 Epigastric pain: Secondary | ICD-10-CM

## 2018-04-13 DIAGNOSIS — R1011 Right upper quadrant pain: Secondary | ICD-10-CM

## 2018-04-13 DIAGNOSIS — K5900 Constipation, unspecified: Secondary | ICD-10-CM

## 2018-04-20 DIAGNOSIS — R1011 Right upper quadrant pain: Secondary | ICD-10-CM | POA: Diagnosis not present

## 2018-04-20 DIAGNOSIS — K30 Functional dyspepsia: Secondary | ICD-10-CM | POA: Diagnosis not present

## 2018-04-20 DIAGNOSIS — R1013 Epigastric pain: Secondary | ICD-10-CM | POA: Diagnosis not present

## 2018-04-20 DIAGNOSIS — K5900 Constipation, unspecified: Secondary | ICD-10-CM | POA: Diagnosis not present

## 2018-07-11 DIAGNOSIS — Z13220 Encounter for screening for lipoid disorders: Secondary | ICD-10-CM | POA: Diagnosis not present

## 2018-07-11 DIAGNOSIS — B001 Herpesviral vesicular dermatitis: Secondary | ICD-10-CM | POA: Diagnosis not present

## 2018-07-11 DIAGNOSIS — J4520 Mild intermittent asthma, uncomplicated: Secondary | ICD-10-CM | POA: Diagnosis not present

## 2018-07-11 DIAGNOSIS — Z0000 Encounter for general adult medical examination without abnormal findings: Secondary | ICD-10-CM | POA: Diagnosis not present

## 2018-07-11 DIAGNOSIS — R1013 Epigastric pain: Secondary | ICD-10-CM | POA: Diagnosis not present

## 2018-07-25 ENCOUNTER — Other Ambulatory Visit (HOSPITAL_COMMUNITY): Payer: Self-pay | Admitting: Physician Assistant

## 2018-07-25 DIAGNOSIS — R1013 Epigastric pain: Secondary | ICD-10-CM

## 2018-07-25 DIAGNOSIS — R1011 Right upper quadrant pain: Secondary | ICD-10-CM | POA: Diagnosis not present

## 2018-07-25 DIAGNOSIS — K5900 Constipation, unspecified: Secondary | ICD-10-CM | POA: Diagnosis not present

## 2018-08-09 ENCOUNTER — Ambulatory Visit (HOSPITAL_COMMUNITY)
Admission: RE | Admit: 2018-08-09 | Discharge: 2018-08-09 | Disposition: A | Discharge status: Home / Self Care | Payer: BLUE CROSS/BLUE SHIELD | Source: Ambulatory Visit | Attending: Physician Assistant | Admitting: Physician Assistant

## 2018-08-09 DIAGNOSIS — R1011 Right upper quadrant pain: Secondary | ICD-10-CM | POA: Diagnosis not present

## 2018-08-09 DIAGNOSIS — R1013 Epigastric pain: Secondary | ICD-10-CM | POA: Insufficient documentation

## 2018-08-09 MED ORDER — TECHNETIUM TC 99M MEBROFENIN IV KIT
5.0000 | PACK | Freq: Once | INTRAVENOUS | Status: AC | PRN
  Administered 2018-08-09: 5 via INTRAVENOUS

## 2018-08-17 DIAGNOSIS — R1011 Right upper quadrant pain: Secondary | ICD-10-CM | POA: Diagnosis not present

## 2018-08-17 DIAGNOSIS — R1013 Epigastric pain: Secondary | ICD-10-CM | POA: Diagnosis not present

## 2018-08-17 DIAGNOSIS — K2940 Chronic atrophic gastritis without bleeding: Secondary | ICD-10-CM | POA: Diagnosis not present

## 2018-08-24 DIAGNOSIS — K2940 Chronic atrophic gastritis without bleeding: Secondary | ICD-10-CM | POA: Diagnosis not present

## 2018-09-14 ENCOUNTER — Other Ambulatory Visit: Payer: Self-pay | Admitting: Physician Assistant

## 2018-09-14 DIAGNOSIS — R1011 Right upper quadrant pain: Secondary | ICD-10-CM | POA: Diagnosis not present

## 2018-09-14 DIAGNOSIS — R110 Nausea: Secondary | ICD-10-CM

## 2018-09-14 DIAGNOSIS — R1013 Epigastric pain: Secondary | ICD-10-CM

## 2018-09-14 DIAGNOSIS — K219 Gastro-esophageal reflux disease without esophagitis: Secondary | ICD-10-CM | POA: Diagnosis not present

## 2018-09-14 DIAGNOSIS — K2950 Unspecified chronic gastritis without bleeding: Secondary | ICD-10-CM | POA: Diagnosis not present

## 2018-09-21 ENCOUNTER — Other Ambulatory Visit: Payer: BLUE CROSS/BLUE SHIELD

## 2018-09-21 DIAGNOSIS — K2950 Unspecified chronic gastritis without bleeding: Secondary | ICD-10-CM | POA: Diagnosis not present

## 2018-09-29 DIAGNOSIS — M940 Chondrocostal junction syndrome [Tietze]: Secondary | ICD-10-CM | POA: Diagnosis not present

## 2018-09-29 DIAGNOSIS — M9903 Segmental and somatic dysfunction of lumbar region: Secondary | ICD-10-CM | POA: Diagnosis not present

## 2018-09-29 DIAGNOSIS — M9905 Segmental and somatic dysfunction of pelvic region: Secondary | ICD-10-CM | POA: Diagnosis not present

## 2018-09-29 DIAGNOSIS — M9902 Segmental and somatic dysfunction of thoracic region: Secondary | ICD-10-CM | POA: Diagnosis not present

## 2018-10-06 ENCOUNTER — Other Ambulatory Visit: Payer: BLUE CROSS/BLUE SHIELD

## 2018-10-07 ENCOUNTER — Ambulatory Visit
Admission: RE | Admit: 2018-10-07 | Discharge: 2018-10-07 | Disposition: A | Discharge status: Home / Self Care | Payer: BLUE CROSS/BLUE SHIELD | Source: Ambulatory Visit | Attending: Physician Assistant | Admitting: Physician Assistant

## 2018-10-07 DIAGNOSIS — K5900 Constipation, unspecified: Secondary | ICD-10-CM | POA: Diagnosis not present

## 2018-10-07 DIAGNOSIS — R110 Nausea: Secondary | ICD-10-CM

## 2018-10-07 DIAGNOSIS — R1011 Right upper quadrant pain: Secondary | ICD-10-CM

## 2018-10-07 DIAGNOSIS — R1013 Epigastric pain: Secondary | ICD-10-CM

## 2018-10-07 MED ORDER — IOPAMIDOL (ISOVUE-300) INJECTION 61%
75.0000 mL | Freq: Once | INTRAVENOUS | Status: AC | PRN
  Administered 2018-10-07: 100 mL via INTRAVENOUS

## 2018-10-10 DIAGNOSIS — M940 Chondrocostal junction syndrome [Tietze]: Secondary | ICD-10-CM | POA: Diagnosis not present

## 2018-10-10 DIAGNOSIS — M9903 Segmental and somatic dysfunction of lumbar region: Secondary | ICD-10-CM | POA: Diagnosis not present

## 2018-10-10 DIAGNOSIS — M9902 Segmental and somatic dysfunction of thoracic region: Secondary | ICD-10-CM | POA: Diagnosis not present

## 2018-10-10 DIAGNOSIS — M9905 Segmental and somatic dysfunction of pelvic region: Secondary | ICD-10-CM | POA: Diagnosis not present

## 2018-10-17 DIAGNOSIS — M940 Chondrocostal junction syndrome [Tietze]: Secondary | ICD-10-CM | POA: Diagnosis not present

## 2018-10-17 DIAGNOSIS — M9905 Segmental and somatic dysfunction of pelvic region: Secondary | ICD-10-CM | POA: Diagnosis not present

## 2018-10-17 DIAGNOSIS — M9902 Segmental and somatic dysfunction of thoracic region: Secondary | ICD-10-CM | POA: Diagnosis not present

## 2018-10-17 DIAGNOSIS — M9903 Segmental and somatic dysfunction of lumbar region: Secondary | ICD-10-CM | POA: Diagnosis not present

## 2018-10-20 DIAGNOSIS — M9903 Segmental and somatic dysfunction of lumbar region: Secondary | ICD-10-CM | POA: Diagnosis not present

## 2018-10-20 DIAGNOSIS — M9902 Segmental and somatic dysfunction of thoracic region: Secondary | ICD-10-CM | POA: Diagnosis not present

## 2018-10-20 DIAGNOSIS — M9905 Segmental and somatic dysfunction of pelvic region: Secondary | ICD-10-CM | POA: Diagnosis not present

## 2018-10-20 DIAGNOSIS — M940 Chondrocostal junction syndrome [Tietze]: Secondary | ICD-10-CM | POA: Diagnosis not present

## 2018-10-21 DIAGNOSIS — R87619 Unspecified abnormal cytological findings in specimens from cervix uteri: Secondary | ICD-10-CM | POA: Diagnosis not present

## 2018-10-21 DIAGNOSIS — Z6827 Body mass index (BMI) 27.0-27.9, adult: Secondary | ICD-10-CM | POA: Diagnosis not present

## 2018-10-21 DIAGNOSIS — B977 Papillomavirus as the cause of diseases classified elsewhere: Secondary | ICD-10-CM | POA: Diagnosis not present

## 2018-10-24 DIAGNOSIS — M940 Chondrocostal junction syndrome [Tietze]: Secondary | ICD-10-CM | POA: Diagnosis not present

## 2018-10-24 DIAGNOSIS — M9905 Segmental and somatic dysfunction of pelvic region: Secondary | ICD-10-CM | POA: Diagnosis not present

## 2018-10-24 DIAGNOSIS — M9902 Segmental and somatic dysfunction of thoracic region: Secondary | ICD-10-CM | POA: Diagnosis not present

## 2018-10-24 DIAGNOSIS — M9903 Segmental and somatic dysfunction of lumbar region: Secondary | ICD-10-CM | POA: Diagnosis not present

## 2018-10-27 DIAGNOSIS — M9903 Segmental and somatic dysfunction of lumbar region: Secondary | ICD-10-CM | POA: Diagnosis not present

## 2018-10-27 DIAGNOSIS — M9902 Segmental and somatic dysfunction of thoracic region: Secondary | ICD-10-CM | POA: Diagnosis not present

## 2018-10-27 DIAGNOSIS — M940 Chondrocostal junction syndrome [Tietze]: Secondary | ICD-10-CM | POA: Diagnosis not present

## 2018-10-27 DIAGNOSIS — M9905 Segmental and somatic dysfunction of pelvic region: Secondary | ICD-10-CM | POA: Diagnosis not present

## 2018-12-14 IMAGING — US US ABDOMEN COMPLETE
1 series · 14 of 25 positions shown · non-contrast
Comparison: None.

CLINICAL DATA: Right upper quadrant/epigastric pain.

EXAM:
ABDOMEN ULTRASOUND COMPLETE

[Series 1: us abdomen complete · 0.26mm/px · 14 of 84 slices shown]
[im 1/84]
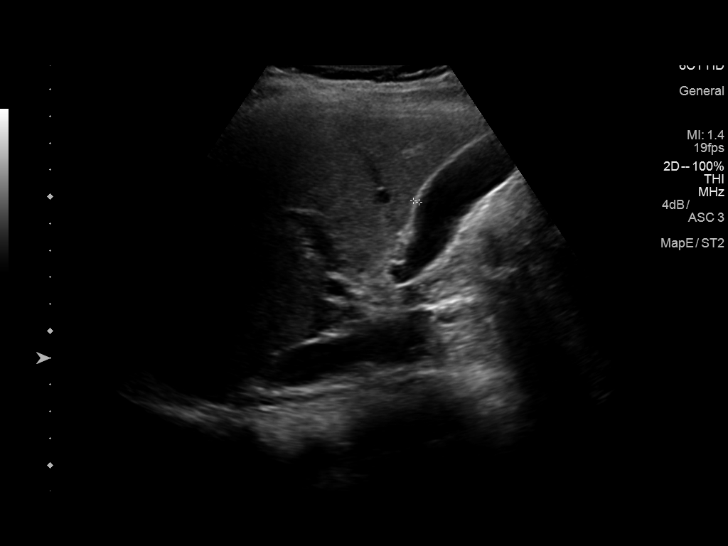
[im 7/84]
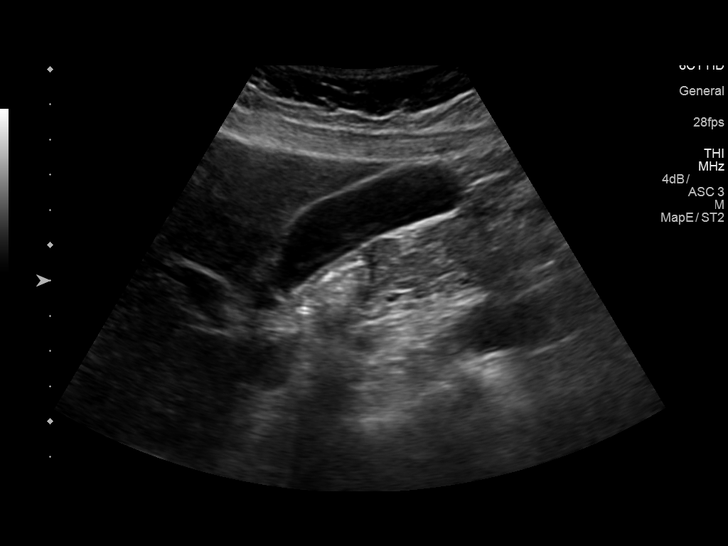
[im 14/84]
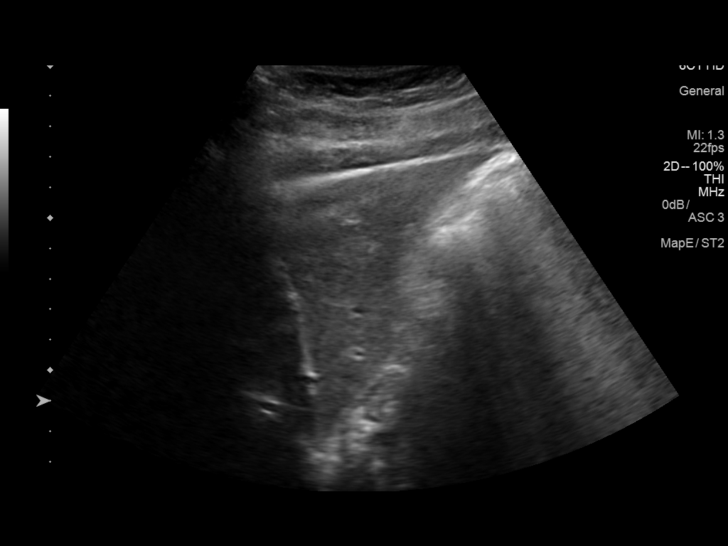
[im 21/84]
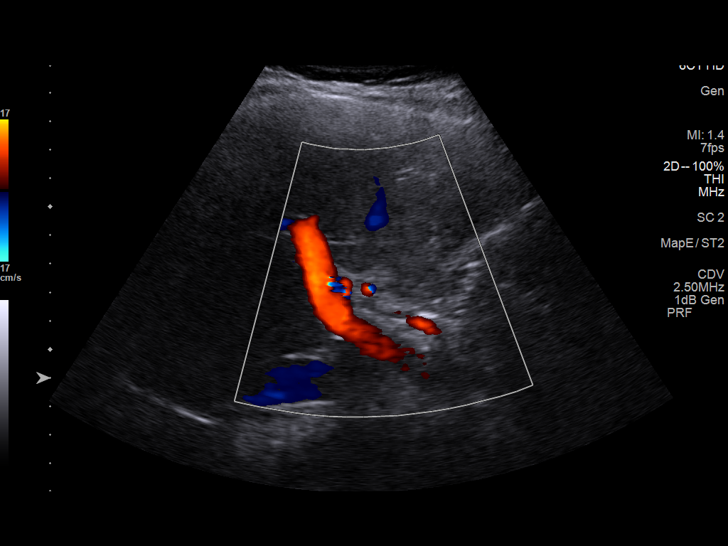
[im 28/84]
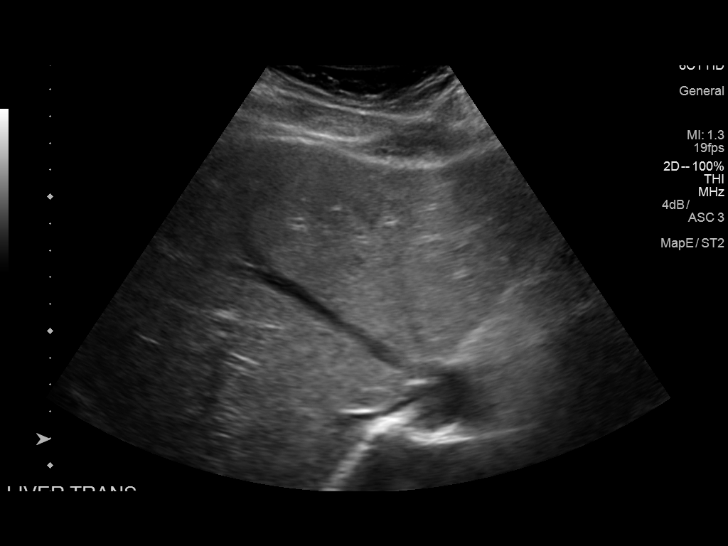
[im 32/84]
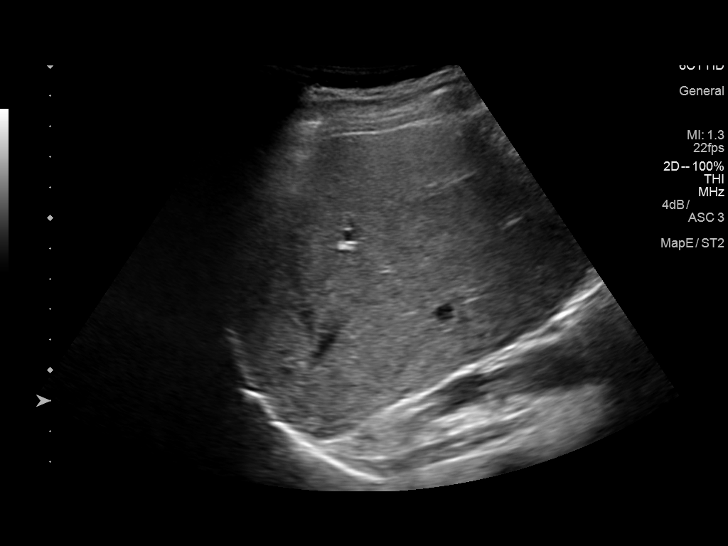
[im 39/84]
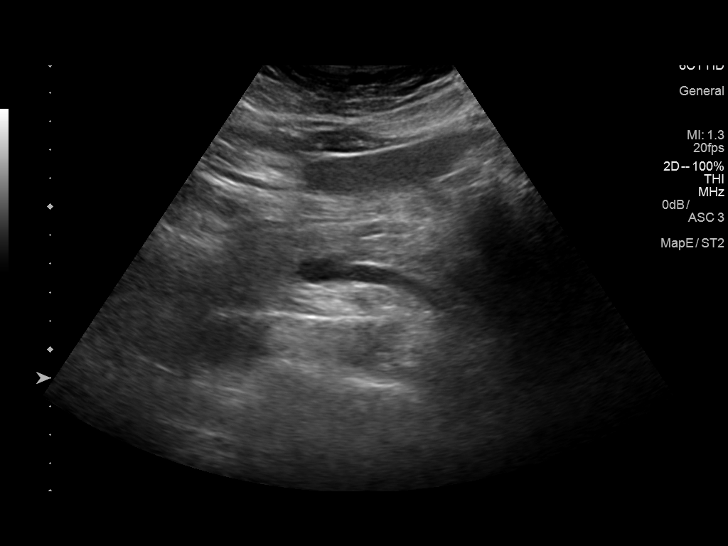
[im 45/84]
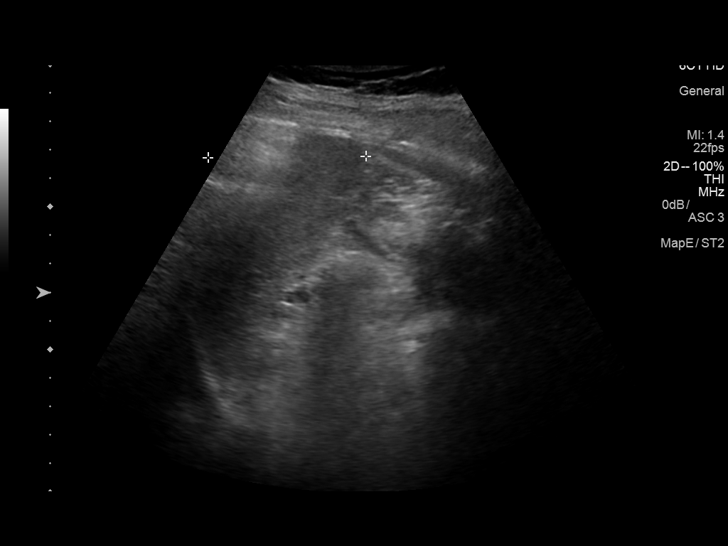
[im 52/84]
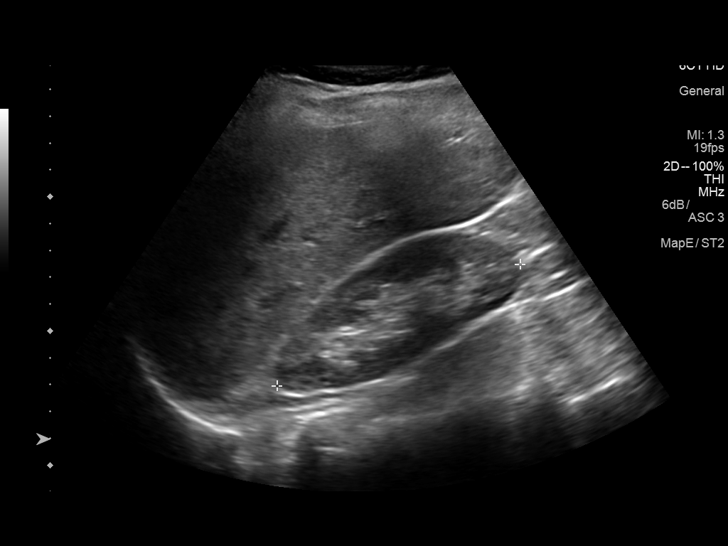
[im 56/84]
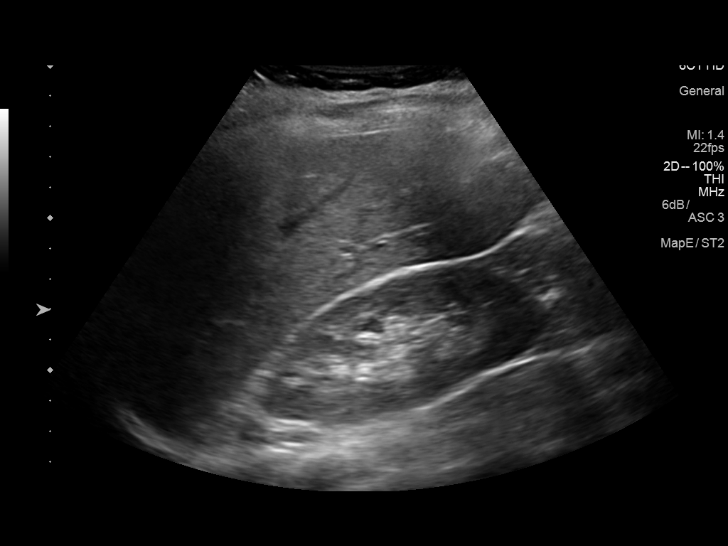
[im 63/84]
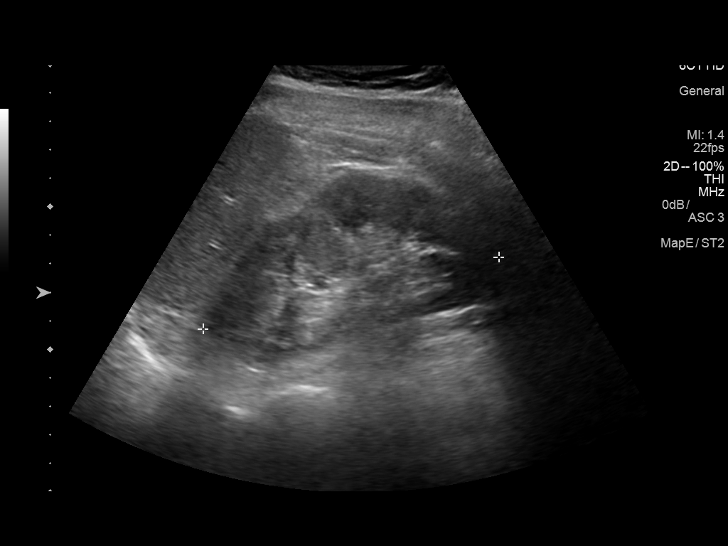
[im 70/84]
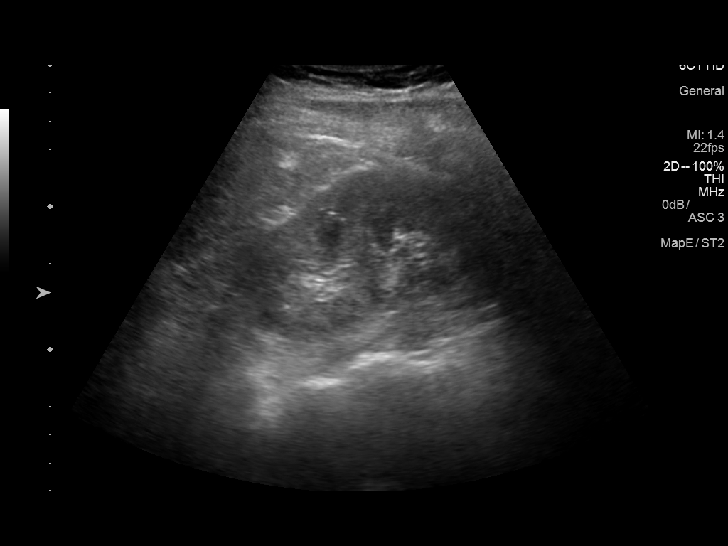
[im 77/84]
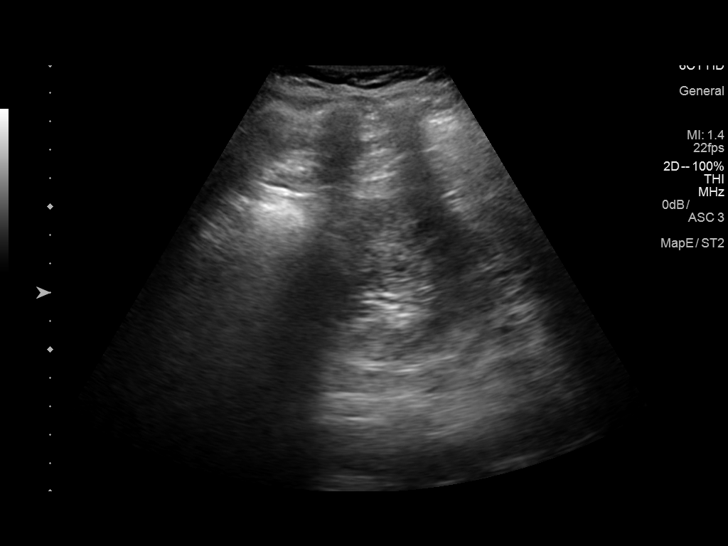
[im 84/84]
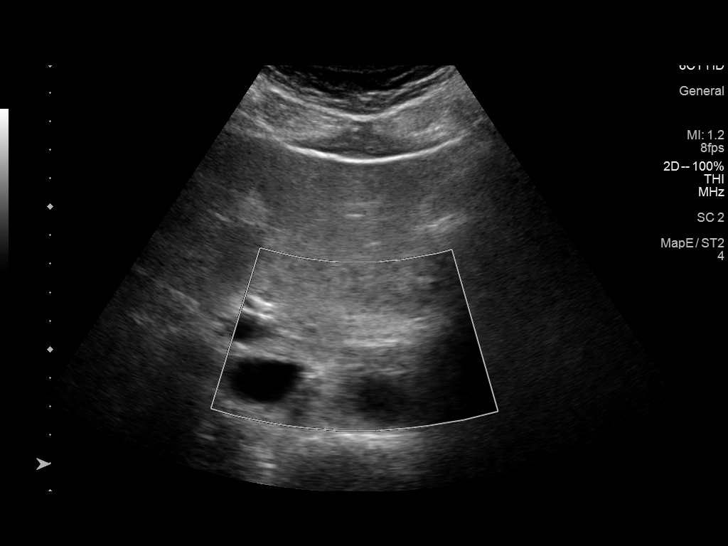

[14 of 25 positions shown; findings below may reference images not displayed]

FINDINGS: Gallbladder: No gallstones or wall thickening visualized. No
sonographic Murphy sign noted by sonographer.

Common bile duct: Diameter: 2.1 mm

Liver: No focal lesion identified. Mildly increased parenchymal
echogenicity. Portal vein is patent on color Doppler imaging with
normal direction of blood flow towards the liver.

IVC: No abnormality visualized.

Pancreas: Visualized portion unremarkable.

Spleen: Size and appearance within normal limits.

Right Kidney: Length: 10.1 cm. Echogenicity within normal limits. No
mass or hydronephrosis visualized.

Left Kidney: Length: 10.7 cm. Echogenicity within normal limits. No
mass or hydronephrosis visualized.

Abdominal aorta: No aneurysm visualized.

Other findings: None.
IMPRESSION: Mildly increased parenchymal echogenicity of the liver, usually
associated with hepatic steatosis.

Otherwise normal abdominal ultrasound.

## 2019-01-31 DIAGNOSIS — A059 Bacterial foodborne intoxication, unspecified: Secondary | ICD-10-CM | POA: Diagnosis not present

## 2019-01-31 DIAGNOSIS — R197 Diarrhea, unspecified: Secondary | ICD-10-CM | POA: Diagnosis not present

## 2019-03-24 ENCOUNTER — Telehealth: Payer: BLUE CROSS/BLUE SHIELD | Admitting: Family

## 2019-03-24 DIAGNOSIS — L237 Allergic contact dermatitis due to plants, except food: Secondary | ICD-10-CM

## 2019-03-24 MED ORDER — PREDNISONE 10 MG (21) PO TBPK: ORAL_TABLET | ORAL | 0 refills | Status: AC

## 2019-03-24 NOTE — Progress Notes (Signed)

## 2019-04-27 ENCOUNTER — Other Ambulatory Visit: Payer: Self-pay | Admitting: Cardiology

## 2019-04-27 DIAGNOSIS — R6889 Other general symptoms and signs: Secondary | ICD-10-CM | POA: Diagnosis not present

## 2019-04-27 DIAGNOSIS — Z20822 Contact with and (suspected) exposure to covid-19: Secondary | ICD-10-CM

## 2019-04-29 LAB — NOVEL CORONAVIRUS, NAA: SARS-CoV-2, NAA: NOT DETECTED

## 2019-08-14 IMAGING — CR DG ABDOMEN 2V
2 series · 2 of 2 positions shown · non-contrast
Comparison: None.

CLINICAL DATA: Epigastric abdominal pain. Right upper quadrant pain

EXAM:
ABDOMEN - 2 VIEW

[w abdomen upright *]
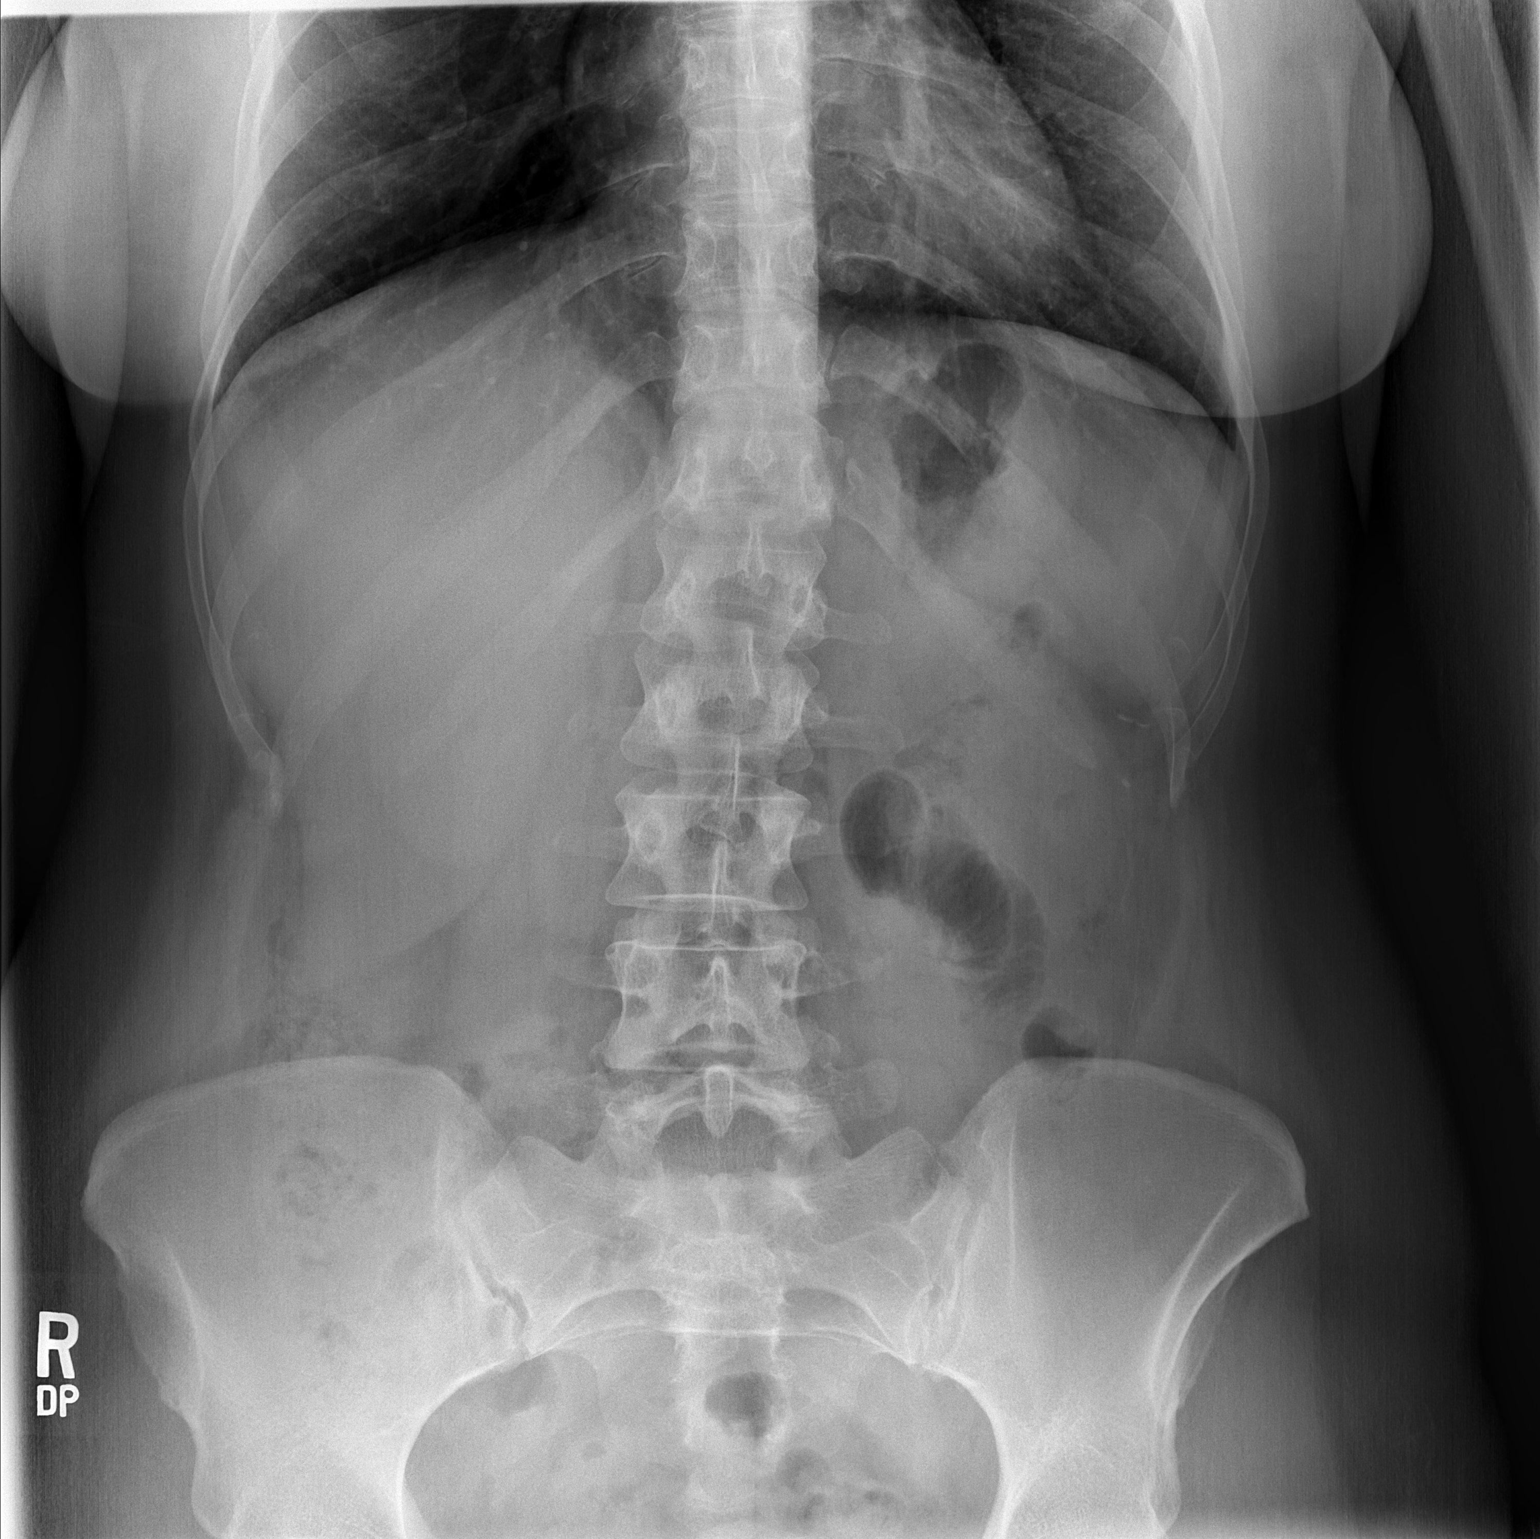

[t abdomen supine]
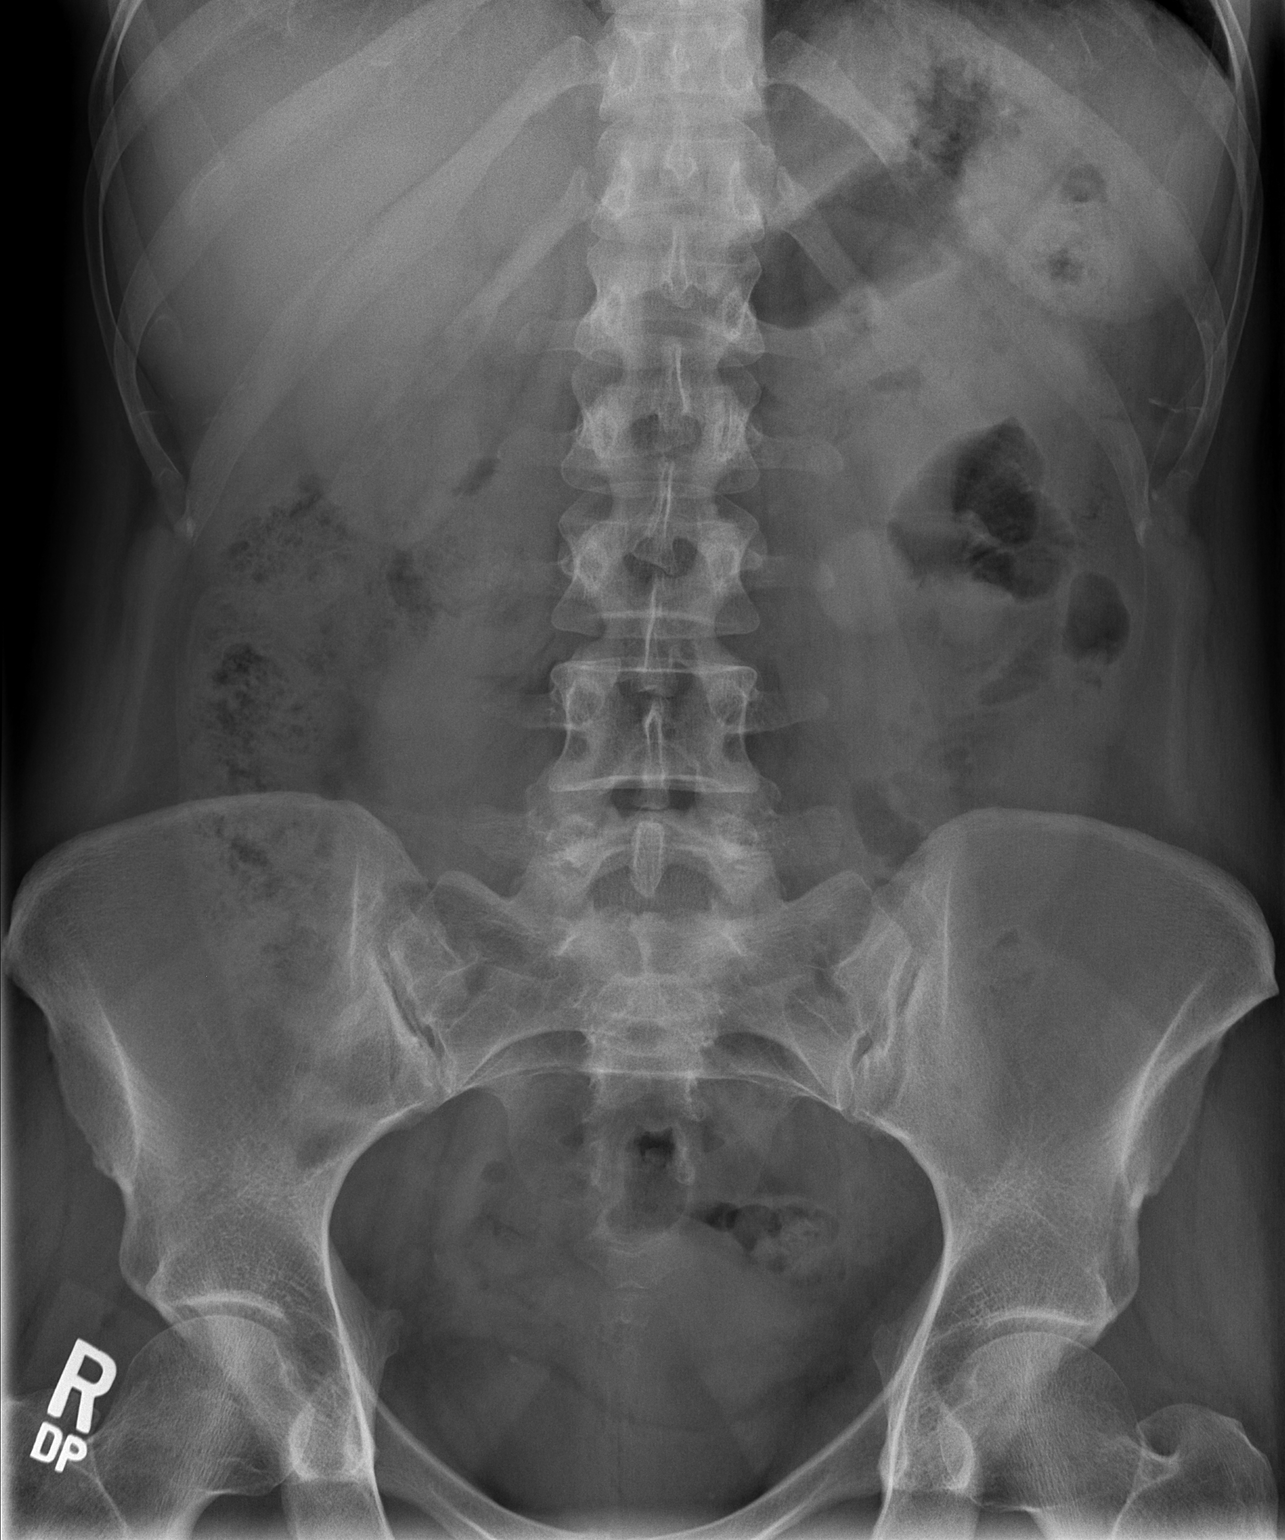

[2 of 2 positions shown; findings below may reference images not displayed]

FINDINGS: Small calcifications in the bilateral pelvis are likely phleboliths
in this setting. Bowel gas pattern is normal. Stool seen in the
ascending colon and to a mild degree in the distal transverse and
sigmoid colon. No rectal impaction. No concerning mass effect or gas
collection. Lung bases are clear. Mild lumbar dextrocurvature.
IMPRESSION: Unremarkable exam.  No stool retention or obstruction.

## 2019-09-04 ENCOUNTER — Other Ambulatory Visit: Payer: BC Managed Care – PPO

## 2019-10-03 DIAGNOSIS — R1011 Right upper quadrant pain: Secondary | ICD-10-CM | POA: Diagnosis not present

## 2019-10-03 DIAGNOSIS — Z1331 Encounter for screening for depression: Secondary | ICD-10-CM | POA: Diagnosis not present

## 2019-10-04 ENCOUNTER — Other Ambulatory Visit (HOSPITAL_COMMUNITY): Payer: Self-pay | Admitting: Internal Medicine

## 2019-10-04 ENCOUNTER — Other Ambulatory Visit: Payer: Self-pay | Admitting: Internal Medicine

## 2019-10-04 DIAGNOSIS — R1011 Right upper quadrant pain: Secondary | ICD-10-CM

## 2019-10-31 ENCOUNTER — Other Ambulatory Visit: Payer: Self-pay

## 2019-10-31 ENCOUNTER — Ambulatory Visit (HOSPITAL_COMMUNITY)
Admission: RE | Admit: 2019-10-31 | Discharge: 2019-10-31 | Disposition: A | Discharge status: Home / Self Care | Payer: BC Managed Care – PPO | Source: Ambulatory Visit | Attending: Internal Medicine | Admitting: Internal Medicine

## 2019-10-31 DIAGNOSIS — R1011 Right upper quadrant pain: Secondary | ICD-10-CM | POA: Diagnosis not present

## 2019-10-31 MED ORDER — TECHNETIUM TC 99M MEBROFENIN IV KIT
5.3000 | PACK | Freq: Once | INTRAVENOUS | Status: AC | PRN
  Administered 2019-10-31: 08:00:00 5.3 via INTRAVENOUS

## 2019-11-03 DIAGNOSIS — R87810 Cervical high risk human papillomavirus (HPV) DNA test positive: Secondary | ICD-10-CM | POA: Diagnosis not present

## 2019-11-03 DIAGNOSIS — Z01419 Encounter for gynecological examination (general) (routine) without abnormal findings: Secondary | ICD-10-CM | POA: Diagnosis not present

## 2019-11-03 DIAGNOSIS — Z6828 Body mass index (BMI) 28.0-28.9, adult: Secondary | ICD-10-CM | POA: Diagnosis not present

## 2019-11-28 DIAGNOSIS — Z0000 Encounter for general adult medical examination without abnormal findings: Secondary | ICD-10-CM | POA: Diagnosis not present

## 2019-11-28 DIAGNOSIS — R1011 Right upper quadrant pain: Secondary | ICD-10-CM | POA: Diagnosis not present

## 2019-12-04 DIAGNOSIS — Z1331 Encounter for screening for depression: Secondary | ICD-10-CM | POA: Diagnosis not present

## 2019-12-04 DIAGNOSIS — L659 Nonscarring hair loss, unspecified: Secondary | ICD-10-CM | POA: Diagnosis not present

## 2019-12-04 DIAGNOSIS — Z0000 Encounter for general adult medical examination without abnormal findings: Secondary | ICD-10-CM | POA: Diagnosis not present

## 2019-12-04 DIAGNOSIS — R7989 Other specified abnormal findings of blood chemistry: Secondary | ICD-10-CM | POA: Diagnosis not present

## 2019-12-04 DIAGNOSIS — R82998 Other abnormal findings in urine: Secondary | ICD-10-CM | POA: Diagnosis not present

## 2019-12-17 IMAGING — NM NM HEPATO W/GB/PHARM/[PERSON_NAME]
3 series · 13 of 13 positions shown · non-contrast
Comparison: None.

CLINICAL DATA: Epigastric region pain

EXAM:
NUCLEAR MEDICINE HEPATOBILIARY IMAGING WITH GALLBLADDER EF
VIEWS:
Anterior right upper quadrant
RADIOPHARMACEUTICALS:  5.0 mCi Qc-RRm  Choletec IV

[he hepatobiliary · 4.52mm/px · 6 of 60 frames shown (1 of 3)]
[frame 6/60]
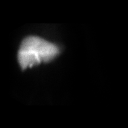
[frame 16/60]
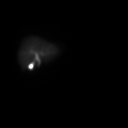
[frame 26/60]
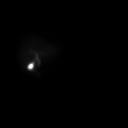
[frame 36/60]
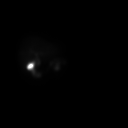
[frame 46/60]
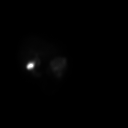
[frame 56/60]
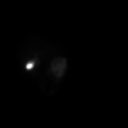

[he hepatobiliary · 4.52mm/px · 6 of 60 frames shown (2 of 3)]
[frame 6/60]
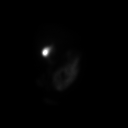
[frame 16/60]
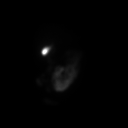
[frame 26/60]
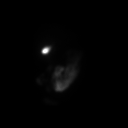
[frame 36/60]
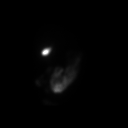
[frame 46/60]
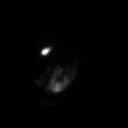
[frame 56/60]
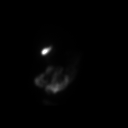

[he hepatobiliary · 2.26mm/px · 1 of 1 slices shown (3 of 3)]
[im 1/1]
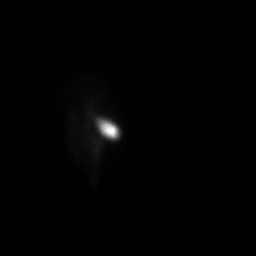

[13 of 13 positions shown; findings below may reference images not displayed]

FINDINGS: Liver uptake of radiotracer is unremarkable. There is prompt
visualization of gallbladder and small bowel, indicating patency of
the cystic and common bile ducts. The patient consumed 8 ounces of
Ensure orally with calculation of the computer generated ejection
fraction of radiotracer from the gallbladder. The patient did not
experience clinical symptoms with the oral Ensure consumption. The
computer generated ejection fraction of radiotracer from the
gallbladder is within normal limits at 40%, normal greater than 33%
using the oral agent.
IMPRESSION: Study within normal limits.

## 2019-12-20 DIAGNOSIS — Z1231 Encounter for screening mammogram for malignant neoplasm of breast: Secondary | ICD-10-CM | POA: Diagnosis not present

## 2020-02-19 DIAGNOSIS — J339 Nasal polyp, unspecified: Secondary | ICD-10-CM | POA: Diagnosis not present

## 2020-02-19 DIAGNOSIS — J329 Chronic sinusitis, unspecified: Secondary | ICD-10-CM | POA: Diagnosis not present

## 2020-02-19 DIAGNOSIS — J3489 Other specified disorders of nose and nasal sinuses: Secondary | ICD-10-CM | POA: Diagnosis not present

## 2020-04-17 ENCOUNTER — Other Ambulatory Visit: Payer: Self-pay

## 2020-04-17 ENCOUNTER — Encounter (INDEPENDENT_AMBULATORY_CARE_PROVIDER_SITE_OTHER): Payer: Self-pay | Admitting: Otolaryngology

## 2020-04-17 ENCOUNTER — Ambulatory Visit (INDEPENDENT_AMBULATORY_CARE_PROVIDER_SITE_OTHER): Payer: BC Managed Care – PPO | Admitting: Otolaryngology

## 2020-04-17 VITALS — Temp 97.2°F

## 2020-04-17 DIAGNOSIS — J3489 Other specified disorders of nose and nasal sinuses: Secondary | ICD-10-CM | POA: Diagnosis not present

## 2020-04-17 DIAGNOSIS — J342 Deviated nasal septum: Secondary | ICD-10-CM | POA: Diagnosis not present

## 2020-04-17 DIAGNOSIS — J322 Chronic ethmoidal sinusitis: Secondary | ICD-10-CM

## 2020-04-17 NOTE — Progress Notes (Signed)
HPI: Brittany Harris is a 39 y.o. female who presents is referred by [facility] for evaluation of chronic sinus issues.  Her symptoms initially began in June after traveling.  When she returned she developed a lot of crusting and scabbing in her nose as well as thick colored mucus discharge.  Her nose felt clogged.  And she was blowing out a lot of yellow-green mucus.  Prior to June she had not had history of sinus problems.  She was treated initially with a course of amoxicillin for 10 days and did much better.  But several weeks later symptoms recurred with thick mucus scabbing and crusting in the nostrils especially on the right side.  She is also had a bad smell.  She has tried some decongestants and antihistamines is doing a little bit better but still having symptoms and is referred here.  She was told that she might have a sinus polyp.  She was also treated with a steroid Dosepak..  Past Medical History:  Diagnosis Date  . Asthma    exercise induced  . Hx of varicella   . Hypertension   . Hypokalemia   . Medical history non-contributory    Past Surgical History:  Procedure Laterality Date  . KNEE SURGERY     approx 4 to 5 years ago  . WISDOM TOOTH EXTRACTION     Social History   Socioeconomic History  . Marital status: Legally Separated    Spouse name: Not on file  . Number of children: Not on file  . Years of education: Not on file  . Highest education level: Not on file  Occupational History  . Not on file  Tobacco Use  . Smoking status: Current Some Day Smoker    Last attempt to quit: 01/07/2015    Years since quitting: 5.2  . Smokeless tobacco: Never Used  Substance and Sexual Activity  . Alcohol use: No  . Drug use: No  . Sexual activity: Yes  Other Topics Concern  . Not on file  Social History Narrative  . Not on file   Social Determinants of Health   Financial Resource Strain:   . Difficulty of Paying Living Expenses: Not on file  Food  Insecurity:   . Worried About Programme researcher, broadcasting/film/video in the Last Year: Not on file  . Ran Out of Food in the Last Year: Not on file  Transportation Needs:   . Lack of Transportation (Medical): Not on file  . Lack of Transportation (Non-Medical): Not on file  Physical Activity:   . Days of Exercise per Week: Not on file  . Minutes of Exercise per Session: Not on file  Stress:   . Feeling of Stress : Not on file  Social Connections:   . Frequency of Communication with Friends and Family: Not on file  . Frequency of Social Gatherings with Friends and Family: Not on file  . Attends Religious Services: Not on file  . Active Member of Clubs or Organizations: Not on file  . Attends Banker Meetings: Not on file  . Marital Status: Not on file   Family History  Problem Relation Age of Onset  . Cancer Paternal Aunt    Allergies  Allergen Reactions  . Neosporin [Neomycin-Bacitracin Zn-Polymyx] Rash   Prior to Admission medications   Medication Sig Start Date End Date Taking? Authorizing Provider  albuterol (PROVENTIL HFA;VENTOLIN HFA) 108 (90 Base) MCG/ACT inhaler Inhale 2 puffs into the lungs every 6 (  six) hours as needed for wheezing or shortness of breath.   Yes [provider]  ferrous sulfate 325 (65 FE) MG tablet Take 1 tablet (325 mg total) by mouth daily with breakfast. 09/14/15  Yes Marlow Baars, MD  ibuprofen (ADVIL,MOTRIN) 600 MG tablet Take 1 tablet (600 mg total) by mouth every 6 (six) hours. 09/14/15  Yes Marlow Baars, MD  magnesium oxide (MAG-OX) 400 (241.3 MG) MG tablet Take 1 tablet (400 mg total) by mouth daily. 07/11/15  Yes Marlow Baars, MD  oxyCODONE-acetaminophen (PERCOCET/ROXICET) 5-325 MG tablet Take 1 tablet by mouth every 6 (six) hours as needed (pain scale 4-7). 09/14/15  Yes Marlow Baars, MD  potassium chloride SA (K-DUR,KLOR-CON) 20 MEQ tablet Take 2 tablets (40 mEq total) by mouth daily. 07/11/15  Yes Marlow Baars, MD  predniSONE (STERAPRED  UNI-PAK 21 TAB) 10 MG (21) TBPK tablet As directed 03/24/19  Yes Worthy Rancher B, FNP  Prenatal Vit-Fe Fumarate-FA (PRENATAL MULTIVITAMIN) TABS tablet Take 1 tablet by mouth daily at 12 noon.   Yes [provider]     Positive ROS: Otherwise negative  All other systems have been reviewed and were otherwise negative with the exception of those mentioned in the HPI and as above.  Physical Exam: Constitutional: Alert, well-appearing, no acute distress Ears: External ears without lesions or tenderness. Ear canals are clear bilaterally with intact, clear TMs bilaterally. Nasal: External nose without lesions. Septum is slightly deviated to the right..  She has some scabbing and crusting in the nostril anteriorly again more so on the right side.  Nasal endoscopy was performed in the office today and on nasal endoscopy there were no polyps noted.  She had mild edema of the middle meatus but no gross mucopurulent discharge noted.  The nasopharynx was clear. Oral: Lips and gums without lesions. Tongue and palate mucosa without lesions. Posterior oropharynx clear. Neck: No palpable adenopathy or masses Respiratory: Breathing comfortably  Skin: No facial/neck lesions or rash noted.  Nasal/sinus endoscopy  Date/Time: 04/17/2020 1:32 PM Performed by: Drema Halon, MD Authorized by: Drema Halon, MD   Consent:    Consent obtained:  Verbal   Consent given by:  Patient Procedure details:    Indications: sino-nasal symptoms     Medication:  Afrin   Instrument: flexible fiberoptic nasal endoscope     Scope location: bilateral nare   Septum:    Deviation: deviated to the right     Severity of deviation: mild   Sinus:    Right middle meatus: normal     Left middle meatus: normal     Right nasopharynx: normal     Left nasopharynx: normal   Comments:     On nasal endoscopy patient has a mild septal deformity to the right impinging on the right middle meatus region.  There  are no polyps noted.  She has mild edema but no gross mucopurulent discharge noted although she has some crusting and scabbing in the anterior nostril more so on the right.    Assessment: Chronic sinusitis and chronic rhinitis.  Plan: Placed her on Augmentin 875 mg twice daily for 2 weeks. Also prescribed Nasacort 2 sprays each nostril at night as this will help with congestion. Recommended use of saline irrigation for thick mucus discharge or dryness in the nose. I prescribed mupirocin 2% ointment to use if she develops any crusting or soreness in the nostrils. She will notify us if symptoms have not resolved following the antibiotics.   Thayer Ohm  Lucia Gaskins, MD   CC:

## 2020-05-14 DIAGNOSIS — J029 Acute pharyngitis, unspecified: Secondary | ICD-10-CM | POA: Diagnosis not present

## 2020-05-14 DIAGNOSIS — Z1152 Encounter for screening for COVID-19: Secondary | ICD-10-CM | POA: Diagnosis not present

## 2020-05-14 DIAGNOSIS — J038 Acute tonsillitis due to other specified organisms: Secondary | ICD-10-CM | POA: Diagnosis not present

## 2024-03-14 ENCOUNTER — Other Ambulatory Visit: Payer: Self-pay | Admitting: Obstetrics and Gynecology

## 2024-03-14 DIAGNOSIS — N83291 Other ovarian cyst, right side: Secondary | ICD-10-CM

## 2024-04-15 ENCOUNTER — Ambulatory Visit
Admission: RE | Admit: 2024-04-15 | Discharge: 2024-04-15 | Disposition: A | Discharge status: Home / Self Care | Source: Ambulatory Visit | Attending: Obstetrics and Gynecology | Admitting: Obstetrics and Gynecology

## 2024-04-15 DIAGNOSIS — N83291 Other ovarian cyst, right side: Secondary | ICD-10-CM

## 2024-04-15 MED ORDER — GADOPICLENOL 0.5 MMOL/ML IV SOLN
9.0000 mL | Freq: Once | INTRAVENOUS | Status: AC | PRN
  Administered 2024-04-15: 9 mL via INTRAVENOUS

## 2024-09-13 ENCOUNTER — Encounter: Payer: Self-pay | Admitting: Internal Medicine
